# Patient Record
Sex: Female | Born: 1955 | Race: Black or African American | Hispanic: No | Smoking: Former smoker
Health system: Southern US, Community
[De-identification: ages and names within clinical notes are randomized; demographics above are authoritative.]

## PROBLEM LIST (undated history)

## (undated) DIAGNOSIS — I10 Essential (primary) hypertension: Secondary | ICD-10-CM

## (undated) DIAGNOSIS — R51 Headache: Secondary | ICD-10-CM

## (undated) DIAGNOSIS — R519 Headache, unspecified: Secondary | ICD-10-CM

## (undated) DIAGNOSIS — J40 Bronchitis, not specified as acute or chronic: Secondary | ICD-10-CM

## (undated) DIAGNOSIS — J45909 Unspecified asthma, uncomplicated: Secondary | ICD-10-CM

## (undated) HISTORY — PX: TUBAL LIGATION: SHX77

---

## 1999-03-12 ENCOUNTER — Emergency Department (HOSPITAL_COMMUNITY): Admission: EM | Admit: 1999-03-12 | Discharge: 1999-03-12 | Payer: Self-pay | Admitting: Emergency Medicine

## 1999-03-12 ENCOUNTER — Encounter: Payer: Self-pay | Admitting: Emergency Medicine

## 1999-05-18 ENCOUNTER — Emergency Department (HOSPITAL_COMMUNITY): Admission: EM | Admit: 1999-05-18 | Discharge: 1999-05-18 | Payer: Self-pay

## 2000-06-04 ENCOUNTER — Emergency Department (HOSPITAL_COMMUNITY): Admission: EM | Admit: 2000-06-04 | Discharge: 2000-06-04 | Payer: Self-pay | Admitting: Emergency Medicine

## 2000-08-20 ENCOUNTER — Emergency Department (HOSPITAL_COMMUNITY): Admission: EM | Admit: 2000-08-20 | Discharge: 2000-08-20 | Payer: Self-pay | Admitting: Emergency Medicine

## 2001-02-16 ENCOUNTER — Encounter: Payer: Self-pay | Admitting: Emergency Medicine

## 2001-02-16 ENCOUNTER — Emergency Department (HOSPITAL_COMMUNITY): Admission: EM | Admit: 2001-02-16 | Discharge: 2001-02-16 | Payer: Self-pay | Admitting: Emergency Medicine

## 2001-07-19 ENCOUNTER — Emergency Department (HOSPITAL_COMMUNITY): Admission: EM | Admit: 2001-07-19 | Discharge: 2001-07-19 | Payer: Self-pay | Admitting: Emergency Medicine

## 2002-10-05 ENCOUNTER — Emergency Department (HOSPITAL_COMMUNITY): Admission: EM | Admit: 2002-10-05 | Discharge: 2002-10-05 | Payer: Self-pay | Admitting: Emergency Medicine

## 2002-10-17 ENCOUNTER — Emergency Department (HOSPITAL_COMMUNITY): Admission: EM | Admit: 2002-10-17 | Discharge: 2002-10-17 | Payer: Self-pay | Admitting: Emergency Medicine

## 2002-11-10 ENCOUNTER — Emergency Department (HOSPITAL_COMMUNITY): Admission: EM | Admit: 2002-11-10 | Discharge: 2002-11-10 | Payer: Self-pay | Admitting: Emergency Medicine

## 2003-05-02 ENCOUNTER — Emergency Department (HOSPITAL_COMMUNITY): Admission: EM | Admit: 2003-05-02 | Discharge: 2003-05-02 | Payer: Self-pay | Admitting: Emergency Medicine

## 2003-05-02 ENCOUNTER — Encounter: Payer: Self-pay | Admitting: Emergency Medicine

## 2005-02-15 ENCOUNTER — Emergency Department (HOSPITAL_COMMUNITY): Admission: EM | Admit: 2005-02-15 | Discharge: 2005-02-15 | Payer: Self-pay | Admitting: Emergency Medicine

## 2005-02-17 ENCOUNTER — Emergency Department (HOSPITAL_COMMUNITY): Admission: EM | Admit: 2005-02-17 | Discharge: 2005-02-17 | Payer: Self-pay | Admitting: Emergency Medicine

## 2005-03-26 ENCOUNTER — Emergency Department (HOSPITAL_COMMUNITY): Admission: EM | Admit: 2005-03-26 | Discharge: 2005-03-26 | Payer: Self-pay | Admitting: Emergency Medicine

## 2005-11-07 ENCOUNTER — Emergency Department (HOSPITAL_COMMUNITY): Admission: EM | Admit: 2005-11-07 | Discharge: 2005-11-07 | Payer: Self-pay | Admitting: Emergency Medicine

## 2005-11-22 ENCOUNTER — Emergency Department (HOSPITAL_COMMUNITY): Admission: EM | Admit: 2005-11-22 | Discharge: 2005-11-22 | Payer: Self-pay | Admitting: *Deleted

## 2006-02-27 ENCOUNTER — Emergency Department (HOSPITAL_COMMUNITY): Admission: EM | Admit: 2006-02-27 | Discharge: 2006-02-27 | Payer: Self-pay | Admitting: Emergency Medicine

## 2007-03-20 ENCOUNTER — Emergency Department (HOSPITAL_COMMUNITY): Admission: EM | Admit: 2007-03-20 | Discharge: 2007-03-21 | Payer: Self-pay | Admitting: Emergency Medicine

## 2007-03-30 ENCOUNTER — Encounter: Admission: RE | Admit: 2007-03-30 | Discharge: 2007-05-06 | Payer: Self-pay | Admitting: Occupational Medicine

## 2007-08-02 ENCOUNTER — Encounter: Admission: RE | Admit: 2007-08-02 | Discharge: 2007-08-02 | Payer: Self-pay | Admitting: Occupational Medicine

## 2007-09-19 ENCOUNTER — Emergency Department (HOSPITAL_COMMUNITY): Admission: EM | Admit: 2007-09-19 | Discharge: 2007-09-19 | Payer: Self-pay | Admitting: Emergency Medicine

## 2008-07-28 ENCOUNTER — Emergency Department (HOSPITAL_COMMUNITY): Admission: EM | Admit: 2008-07-28 | Discharge: 2008-07-28 | Payer: Self-pay | Admitting: Emergency Medicine

## 2008-11-30 ENCOUNTER — Encounter: Admission: RE | Admit: 2008-11-30 | Discharge: 2008-11-30 | Payer: Self-pay | Admitting: Internal Medicine

## 2008-12-18 ENCOUNTER — Encounter: Admission: RE | Admit: 2008-12-18 | Discharge: 2008-12-18 | Payer: Self-pay | Admitting: Internal Medicine

## 2008-12-21 ENCOUNTER — Emergency Department (HOSPITAL_COMMUNITY): Admission: EM | Admit: 2008-12-21 | Discharge: 2008-12-21 | Payer: Self-pay | Admitting: Emergency Medicine

## 2008-12-29 ENCOUNTER — Ambulatory Visit (HOSPITAL_COMMUNITY): Admission: RE | Admit: 2008-12-29 | Discharge: 2008-12-29 | Payer: Self-pay | Admitting: Cardiovascular Disease

## 2009-02-15 ENCOUNTER — Emergency Department (HOSPITAL_COMMUNITY): Admission: EM | Admit: 2009-02-15 | Discharge: 2009-02-15 | Payer: Self-pay | Admitting: Emergency Medicine

## 2010-03-13 ENCOUNTER — Encounter: Admission: RE | Admit: 2010-03-13 | Discharge: 2010-03-13 | Payer: Self-pay | Admitting: Internal Medicine

## 2010-07-04 ENCOUNTER — Emergency Department (HOSPITAL_COMMUNITY)
Admission: EM | Admit: 2010-07-04 | Discharge: 2010-07-04 | Payer: Self-pay | Source: Home / Self Care | Admitting: Emergency Medicine

## 2010-10-06 ENCOUNTER — Encounter: Payer: Self-pay | Admitting: Internal Medicine

## 2010-12-25 LAB — DIFFERENTIAL
Eosinophils Relative: 2 % (ref 0–5)
Lymphocytes Relative: 29 % (ref 12–46)
Lymphs Abs: 2 10*3/uL (ref 0.7–4.0)
Monocytes Relative: 6 % (ref 3–12)

## 2010-12-25 LAB — POCT CARDIAC MARKERS
Myoglobin, poc: 36.9 ng/mL (ref 12–200)
Myoglobin, poc: 53 ng/mL (ref 12–200)

## 2010-12-25 LAB — POCT I-STAT, CHEM 8
BUN: 12 mg/dL (ref 6–23)
Hemoglobin: 13.3 g/dL (ref 12.0–15.0)
Sodium: 142 mEq/L (ref 135–145)
TCO2: 25 mmol/L (ref 0–100)

## 2010-12-25 LAB — URINALYSIS, ROUTINE W REFLEX MICROSCOPIC
Glucose, UA: NEGATIVE mg/dL
Hgb urine dipstick: NEGATIVE
Ketones, ur: NEGATIVE mg/dL
Protein, ur: NEGATIVE mg/dL

## 2010-12-25 LAB — CK TOTAL AND CKMB (NOT AT ARMC): Relative Index: INVALID (ref 0.0–2.5)

## 2010-12-25 LAB — URINE CULTURE: Colony Count: 80000

## 2010-12-25 LAB — URINE MICROSCOPIC-ADD ON

## 2010-12-25 LAB — CBC
HCT: 37.5 % (ref 36.0–46.0)
Platelets: 241 10*3/uL (ref 150–400)
RBC: 4.99 MIL/uL (ref 3.87–5.11)
WBC: 7 10*3/uL (ref 4.0–10.5)

## 2010-12-25 LAB — GLUCOSE, CAPILLARY

## 2010-12-25 LAB — BRAIN NATRIURETIC PEPTIDE: Pro B Natriuretic peptide (BNP): <30 pg/mL (ref 0.0–100.0)

## 2010-12-25 LAB — PROTIME-INR
INR: 0.9 (ref 0.00–1.49)
Prothrombin Time: 12.6 seconds (ref 11.6–15.2)

## 2011-01-28 NOTE — Cardiovascular Report (Signed)
NAMEJENNENE, DOWNIE             ACCOUNT NO.:  [id number]   MEDICAL RECORD NO.:  [id number]          PATIENT TYPE:  OIB   LOCATION:  2899                         FACILITY:  [facility]   PHYSICIAN:  Nanetta Batty, M.D.   DATE OF BIRTH:  1955-12-02   DATE OF PROCEDURE:  12/29/2008  DATE OF DISCHARGE:                            CARDIAC CATHETERIZATION   INDICATIONS FOR PROCEDURE:  Ms. Reifschneider is a 55 year old mildly  overweight African American female with positive cardiac risk factors  including a 25-pack year history of tobacco abuse and having quit 7  years ago, hypertension, hyperlipidemia, and non-insulin requiring  diabetes.  She has been having chest pain for the last 2-3 months,  occurring daily with poorly dull sensation substernally with some  shortness of breath and diaphoresis.  A 2-D echo was normal as was the  Myoview stress test.  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   DESCRIPTION OF PROCEDURE:  The patient was brought to the Second Floor  [facility] in the postabsorptive state.  She was  premedicated with p.o. Valium.  Her right groin was prepped and shaved  in the usual sterile fashion.  Xylocaine 1% was used for local  anesthesia.  A 6-French sheath was inserted into the right femoral  artery using standard Seldinger technique.  A 6-French right and left  Judkins diagnostic catheter as well as French pigtail catheter were used  for selective coronary angiography, left ventriculography, and distal  abdominal aortography.  Visipaque dye was used for the entirety of the  case.  Thoracic aorta, left ventricular, and pullback pressures were  recorded.   HEMODYNAMICS:  1. Aortic systolic pressure 147, diastolic pressure 73.  2. Left ventricular systolic pressure  153, end-diastolic pressure 19.   SELECTIVE CORONARY ANGIOGRAPHY:  1. Left main normal.  2. LAD normal.  3. Left circumflex normal.  4. Ramus branch was small and normal.  5. Right coronary is dominant and normal.   LEFT VENTRICULOGRAPHY:  RAO left ventriculogram was performed using 25  mL of Visipaque dye at 12 mL per second.  The overall LVEF was estimated  greater than 60% without focal wall motion abnormalities.   DISTAL ABDOMINAL AORTOGRAPHY:  Distal abdominal aortogram was performed  using 20 mL of Visipaque dye at 20 mL per second.  The renal arteries  were widely patent.  The infrarenal abdominal aorta and iliac  bifurcation appeared free of significant disease and atherosclerotic  changes.   IMPRESSION:  Ms. Pelot has normal coronary arteries and normal left  ventricular function.  I believe her chest pain is noncardiac.  Empiric  antireflux therapy will be recommended.   Sheath was removed and pressure was held on the groin to achieve  hemostasis.  The patient left the lab in stable condition.  She will be  discharged home later today as an outpatient and will see me back in the  office in approximately 1 week for followup.  Dr. Fleet Contras was  notified of these results.      Nanetta Batty, M.D.  Electronically Signed     JB/MEDQ  D:  12/29/2008  T:  12/29/2008  Job:  [id number]   cc:   Second Floor [facility]  [facility]  Fleet Contras, M.D.

## 2011-10-22 ENCOUNTER — Emergency Department (HOSPITAL_COMMUNITY)
Admission: EM | Admit: 2011-10-22 | Discharge: 2011-10-22 | Disposition: A | Discharge status: Home / Self Care | Payer: No Typology Code available for payment source | Attending: Emergency Medicine | Admitting: Emergency Medicine

## 2011-10-22 ENCOUNTER — Encounter (HOSPITAL_COMMUNITY): Payer: Self-pay | Admitting: Emergency Medicine

## 2011-10-22 DIAGNOSIS — T148XXA Other injury of unspecified body region, initial encounter: Secondary | ICD-10-CM | POA: Insufficient documentation

## 2011-10-22 DIAGNOSIS — M546 Pain in thoracic spine: Secondary | ICD-10-CM | POA: Insufficient documentation

## 2011-10-22 DIAGNOSIS — M545 Low back pain, unspecified: Secondary | ICD-10-CM | POA: Insufficient documentation

## 2011-10-22 DIAGNOSIS — E119 Type 2 diabetes mellitus without complications: Secondary | ICD-10-CM | POA: Insufficient documentation

## 2011-10-22 DIAGNOSIS — M542 Cervicalgia: Secondary | ICD-10-CM | POA: Insufficient documentation

## 2011-10-22 DIAGNOSIS — M25559 Pain in unspecified hip: Secondary | ICD-10-CM | POA: Insufficient documentation

## 2011-10-22 DIAGNOSIS — I10 Essential (primary) hypertension: Secondary | ICD-10-CM | POA: Insufficient documentation

## 2011-10-22 HISTORY — DX: Essential (primary) hypertension: I10

## 2011-10-22 MED ORDER — METHOCARBAMOL 500 MG PO TABS: 500.0000 mg | ORAL_TABLET | Freq: Two times a day (BID) | ORAL | Status: AC

## 2011-10-22 MED ORDER — IBUPROFEN 800 MG PO TABS: 800.0000 mg | ORAL_TABLET | Freq: Three times a day (TID) | ORAL | Status: AC

## 2011-10-22 MED ORDER — TRAMADOL HCL 50 MG PO TABS: 50.0000 mg | ORAL_TABLET | Freq: Four times a day (QID) | ORAL | Status: AC | PRN
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
## 2011-10-23 NOTE — ED Provider Notes (Signed)
Medical screening examination/treatment/procedure(s) were performed by non-physician practitioner and as supervising physician I was immediately available for consultation/collaboration.   Forbes Cellar, MD 10/23/11 1431

## 2012-05-07 ENCOUNTER — Encounter (HOSPITAL_COMMUNITY): Payer: Self-pay

## 2012-05-07 ENCOUNTER — Emergency Department (HOSPITAL_COMMUNITY)
Admission: EM | Admit: 2012-05-07 | Discharge: 2012-05-07 | Disposition: A | Discharge status: Home / Self Care | Payer: BC Managed Care – PPO | Attending: Emergency Medicine | Admitting: Emergency Medicine

## 2012-05-07 DIAGNOSIS — Z8249 Family history of ischemic heart disease and other diseases of the circulatory system: Secondary | ICD-10-CM | POA: Insufficient documentation

## 2012-05-07 DIAGNOSIS — I10 Essential (primary) hypertension: Secondary | ICD-10-CM | POA: Insufficient documentation

## 2012-05-07 DIAGNOSIS — Z87891 Personal history of nicotine dependence: Secondary | ICD-10-CM | POA: Insufficient documentation

## 2012-05-07 DIAGNOSIS — T2005XA Burn of unspecified degree of scalp [any part], initial encounter: Secondary | ICD-10-CM | POA: Insufficient documentation

## 2012-05-07 DIAGNOSIS — IMO0002 Reserved for concepts with insufficient information to code with codable children: Secondary | ICD-10-CM | POA: Insufficient documentation

## 2012-05-07 DIAGNOSIS — T2045XA Corrosion of unspecified degree of scalp [any part], initial encounter: Secondary | ICD-10-CM

## 2012-05-07 DIAGNOSIS — Z885 Allergy status to narcotic agent status: Secondary | ICD-10-CM | POA: Insufficient documentation

## 2012-05-07 DIAGNOSIS — Z833 Family history of diabetes mellitus: Secondary | ICD-10-CM | POA: Insufficient documentation

## 2012-05-07 DIAGNOSIS — E119 Type 2 diabetes mellitus without complications: Secondary | ICD-10-CM | POA: Insufficient documentation

## 2012-05-07 HISTORY — DX: Bronchitis, not specified as acute or chronic: J40

## 2012-05-07 MED ORDER — KETOROLAC TROMETHAMINE 30 MG/ML IJ SOLN
30.0000 mg | Freq: Once | INTRAMUSCULAR | Status: AC
Start: 2012-05-07 — End: 2012-05-07
  Administered 2012-05-07: 30 mg via INTRAMUSCULAR
  Filled 2012-05-07: qty 1

## 2012-05-07 MED ORDER — IBUPROFEN 800 MG PO TABS: 800.0000 mg | ORAL_TABLET | Freq: Three times a day (TID) | ORAL | Status: AC
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
## 2012-06-14 ENCOUNTER — Other Ambulatory Visit: Payer: Self-pay | Admitting: Internal Medicine

## 2012-06-14 DIAGNOSIS — R921 Mammographic calcification found on diagnostic imaging of breast: Secondary | ICD-10-CM

## 2012-07-21 ENCOUNTER — Ambulatory Visit
Admission: RE | Admit: 2012-07-21 | Discharge: 2012-07-21 | Disposition: A | Discharge status: Home / Self Care | Payer: BC Managed Care – PPO | Source: Ambulatory Visit | Attending: Internal Medicine | Admitting: Internal Medicine

## 2012-07-21 DIAGNOSIS — R921 Mammographic calcification found on diagnostic imaging of breast: Secondary | ICD-10-CM

## 2012-11-06 ENCOUNTER — Emergency Department (HOSPITAL_COMMUNITY)
Admission: EM | Admit: 2012-11-06 | Discharge: 2012-11-06 | Disposition: A | Discharge status: Home / Self Care | Payer: BC Managed Care – PPO | Attending: Emergency Medicine | Admitting: Emergency Medicine

## 2012-11-06 DIAGNOSIS — Z79899 Other long term (current) drug therapy: Secondary | ICD-10-CM | POA: Insufficient documentation

## 2012-11-06 DIAGNOSIS — M5416 Radiculopathy, lumbar region: Secondary | ICD-10-CM

## 2012-11-06 DIAGNOSIS — X503XXA Overexertion from repetitive movements, initial encounter: Secondary | ICD-10-CM | POA: Insufficient documentation

## 2012-11-06 DIAGNOSIS — Z7982 Long term (current) use of aspirin: Secondary | ICD-10-CM | POA: Insufficient documentation

## 2012-11-06 DIAGNOSIS — E119 Type 2 diabetes mellitus without complications: Secondary | ICD-10-CM | POA: Insufficient documentation

## 2012-11-06 DIAGNOSIS — I10 Essential (primary) hypertension: Secondary | ICD-10-CM | POA: Insufficient documentation

## 2012-11-06 DIAGNOSIS — S8990XA Unspecified injury of unspecified lower leg, initial encounter: Secondary | ICD-10-CM | POA: Insufficient documentation

## 2012-11-06 DIAGNOSIS — Z87891 Personal history of nicotine dependence: Secondary | ICD-10-CM | POA: Insufficient documentation

## 2012-11-06 DIAGNOSIS — Y9389 Activity, other specified: Secondary | ICD-10-CM | POA: Insufficient documentation

## 2012-11-06 DIAGNOSIS — Z8709 Personal history of other diseases of the respiratory system: Secondary | ICD-10-CM | POA: Insufficient documentation

## 2012-11-06 DIAGNOSIS — M549 Dorsalgia, unspecified: Secondary | ICD-10-CM

## 2012-11-06 DIAGNOSIS — Y9289 Other specified places as the place of occurrence of the external cause: Secondary | ICD-10-CM | POA: Insufficient documentation

## 2012-11-06 DIAGNOSIS — IMO0002 Reserved for concepts with insufficient information to code with codable children: Secondary | ICD-10-CM | POA: Insufficient documentation

## 2012-11-06 MED ORDER — HYDROCODONE-ACETAMINOPHEN 5-325 MG PO TABS: 1.0000 | ORAL_TABLET | Freq: Four times a day (QID) | ORAL | Status: DC | PRN

## 2012-11-06 MED ORDER — CYCLOBENZAPRINE HCL 10 MG PO TABS: 10.0000 mg | ORAL_TABLET | Freq: Two times a day (BID) | ORAL | Status: DC | PRN

## 2012-11-06 MED ORDER — PREDNISONE 20 MG PO TABS: ORAL_TABLET | ORAL | Status: DC

## 2012-11-06 MED ORDER — PREDNISONE 20 MG PO TABS
60.0000 mg | ORAL_TABLET | Freq: Once | ORAL | Status: AC
  Administered 2012-11-06: 60 mg via ORAL
  Filled 2012-11-06: qty 3
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
## 2012-11-08 NOTE — ED Provider Notes (Signed)
Medical screening examination/treatment/procedure(s) were performed by non-physician practitioner and as supervising physician I was immediately available for consultation/collaboration.  Erinne Gillentine M Maleeya Peterkin, MD 11/08/12 1902 

## 2013-02-08 ENCOUNTER — Emergency Department (HOSPITAL_COMMUNITY)
Admission: EM | Admit: 2013-02-08 | Discharge: 2013-02-08 | Disposition: A | Discharge status: Home / Self Care | Payer: BC Managed Care – PPO | Source: Home / Self Care

## 2013-08-26 ENCOUNTER — Encounter (HOSPITAL_COMMUNITY): Payer: Self-pay | Admitting: Emergency Medicine

## 2013-08-26 ENCOUNTER — Emergency Department (HOSPITAL_COMMUNITY)
Admission: EM | Admit: 2013-08-26 | Discharge: 2013-08-26 | Disposition: A | Discharge status: Home / Self Care | Payer: No Typology Code available for payment source | Attending: Emergency Medicine | Admitting: Emergency Medicine

## 2013-08-26 ENCOUNTER — Emergency Department (HOSPITAL_COMMUNITY): Payer: No Typology Code available for payment source

## 2013-08-26 DIAGNOSIS — Y9389 Activity, other specified: Secondary | ICD-10-CM | POA: Insufficient documentation

## 2013-08-26 DIAGNOSIS — I10 Essential (primary) hypertension: Secondary | ICD-10-CM | POA: Insufficient documentation

## 2013-08-26 DIAGNOSIS — H9209 Otalgia, unspecified ear: Secondary | ICD-10-CM | POA: Insufficient documentation

## 2013-08-26 DIAGNOSIS — S29012A Strain of muscle and tendon of back wall of thorax, initial encounter: Secondary | ICD-10-CM

## 2013-08-26 DIAGNOSIS — Z79899 Other long term (current) drug therapy: Secondary | ICD-10-CM | POA: Insufficient documentation

## 2013-08-26 DIAGNOSIS — Y92410 Unspecified street and highway as the place of occurrence of the external cause: Secondary | ICD-10-CM | POA: Insufficient documentation

## 2013-08-26 DIAGNOSIS — Z87891 Personal history of nicotine dependence: Secondary | ICD-10-CM | POA: Insufficient documentation

## 2013-08-26 DIAGNOSIS — S0990XA Unspecified injury of head, initial encounter: Secondary | ICD-10-CM | POA: Insufficient documentation

## 2013-08-26 DIAGNOSIS — IMO0002 Reserved for concepts with insufficient information to code with codable children: Secondary | ICD-10-CM | POA: Insufficient documentation

## 2013-08-26 DIAGNOSIS — R197 Diarrhea, unspecified: Secondary | ICD-10-CM | POA: Insufficient documentation

## 2013-08-26 DIAGNOSIS — E119 Type 2 diabetes mellitus without complications: Secondary | ICD-10-CM | POA: Insufficient documentation

## 2013-08-26 DIAGNOSIS — J3489 Other specified disorders of nose and nasal sinuses: Secondary | ICD-10-CM | POA: Insufficient documentation

## 2013-08-26 DIAGNOSIS — S0993XA Unspecified injury of face, initial encounter: Secondary | ICD-10-CM | POA: Insufficient documentation

## 2013-08-26 DIAGNOSIS — J40 Bronchitis, not specified as acute or chronic: Secondary | ICD-10-CM

## 2013-08-26 MED ORDER — ALBUTEROL SULFATE (5 MG/ML) 0.5% IN NEBU
2.5000 mg | INHALATION_SOLUTION | RESPIRATORY_TRACT | Status: DC
  Administered 2013-08-26: 2.5 mg via RESPIRATORY_TRACT
  Filled 2013-08-26: qty 0.5

## 2013-08-26 MED ORDER — PREDNISONE 20 MG PO TABS: 40.0000 mg | ORAL_TABLET | Freq: Every day | ORAL | Status: DC

## 2013-08-26 MED ORDER — IBUPROFEN 800 MG PO TABS: 800.0000 mg | ORAL_TABLET | Freq: Three times a day (TID) | ORAL | Status: DC

## 2013-08-26 MED ORDER — IBUPROFEN 400 MG PO TABS
800.0000 mg | ORAL_TABLET | Freq: Once | ORAL | Status: AC
  Administered 2013-08-26: 800 mg via ORAL
  Filled 2013-08-26: qty 2

## 2013-08-26 MED ORDER — IPRATROPIUM BROMIDE 0.02 % IN SOLN
0.5000 mg | RESPIRATORY_TRACT | Status: DC
  Administered 2013-08-26: 0.5 mg via RESPIRATORY_TRACT
  Filled 2013-08-26: qty 2.5

## 2013-08-26 MED ORDER — ALBUTEROL SULFATE HFA 108 (90 BASE) MCG/ACT IN AERS: 1.0000 | INHALATION_SPRAY | Freq: Four times a day (QID) | RESPIRATORY_TRACT | Status: DC | PRN
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
## 2013-08-29 NOTE — ED Provider Notes (Signed)
Medical screening examination/treatment/procedure(s) were performed by non-physician practitioner and as supervising physician I was immediately available for consultation/collaboration.  EKG Interpretation   None         Aubriee Szeto M Aarsh Fristoe, DO 08/29/13 0816 

## 2014-01-23 ENCOUNTER — Encounter (HOSPITAL_COMMUNITY): Payer: Self-pay | Admitting: Emergency Medicine

## 2014-01-23 ENCOUNTER — Emergency Department (HOSPITAL_COMMUNITY)
Admission: EM | Admit: 2014-01-23 | Discharge: 2014-01-23 | Disposition: A | Discharge status: Home / Self Care | Payer: Worker's Compensation | Attending: Emergency Medicine | Admitting: Emergency Medicine

## 2014-01-23 ENCOUNTER — Emergency Department (HOSPITAL_COMMUNITY): Payer: Worker's Compensation

## 2014-01-23 DIAGNOSIS — Y9389 Activity, other specified: Secondary | ICD-10-CM | POA: Insufficient documentation

## 2014-01-23 DIAGNOSIS — S4980XA Other specified injuries of shoulder and upper arm, unspecified arm, initial encounter: Secondary | ICD-10-CM | POA: Insufficient documentation

## 2014-01-23 DIAGNOSIS — Z87891 Personal history of nicotine dependence: Secondary | ICD-10-CM | POA: Insufficient documentation

## 2014-01-23 DIAGNOSIS — IMO0002 Reserved for concepts with insufficient information to code with codable children: Secondary | ICD-10-CM | POA: Insufficient documentation

## 2014-01-23 DIAGNOSIS — J45909 Unspecified asthma, uncomplicated: Secondary | ICD-10-CM | POA: Insufficient documentation

## 2014-01-23 DIAGNOSIS — Y92410 Unspecified street and highway as the place of occurrence of the external cause: Secondary | ICD-10-CM | POA: Insufficient documentation

## 2014-01-23 DIAGNOSIS — R42 Dizziness and giddiness: Secondary | ICD-10-CM | POA: Insufficient documentation

## 2014-01-23 DIAGNOSIS — S0993XA Unspecified injury of face, initial encounter: Secondary | ICD-10-CM | POA: Insufficient documentation

## 2014-01-23 DIAGNOSIS — S199XXA Unspecified injury of neck, initial encounter: Secondary | ICD-10-CM

## 2014-01-23 DIAGNOSIS — I10 Essential (primary) hypertension: Secondary | ICD-10-CM | POA: Insufficient documentation

## 2014-01-23 DIAGNOSIS — S46909A Unspecified injury of unspecified muscle, fascia and tendon at shoulder and upper arm level, unspecified arm, initial encounter: Secondary | ICD-10-CM | POA: Insufficient documentation

## 2014-01-23 DIAGNOSIS — E119 Type 2 diabetes mellitus without complications: Secondary | ICD-10-CM | POA: Insufficient documentation

## 2014-01-23 DIAGNOSIS — Z791 Long term (current) use of non-steroidal anti-inflammatories (NSAID): Secondary | ICD-10-CM | POA: Insufficient documentation

## 2014-01-23 DIAGNOSIS — Z79899 Other long term (current) drug therapy: Secondary | ICD-10-CM | POA: Insufficient documentation

## 2014-01-23 DIAGNOSIS — S0990XA Unspecified injury of head, initial encounter: Secondary | ICD-10-CM | POA: Insufficient documentation

## 2014-01-23 HISTORY — DX: Unspecified asthma, uncomplicated: J45.909

## 2014-01-23 MED ORDER — IBUPROFEN 800 MG PO TABS: 800.0000 mg | ORAL_TABLET | Freq: Three times a day (TID) | ORAL | Status: DC

## 2014-01-23 MED ORDER — METHOCARBAMOL 500 MG PO TABS: 500.0000 mg | ORAL_TABLET | Freq: Two times a day (BID) | ORAL | Status: DC

## 2014-01-23 MED ORDER — METHOCARBAMOL 500 MG PO TABS
500.0000 mg | ORAL_TABLET | Freq: Once | ORAL | Status: AC
  Administered 2014-01-23: 500 mg via ORAL
  Filled 2014-01-23: qty 1
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
## 2014-01-23 NOTE — ED Provider Notes (Signed)
Medical screening examination/treatment/procedure(s) were performed by non-physician practitioner and as supervising physician I was immediately available for consultation/collaboration.   EKG Interpretation None        Hector Taft, MD 01/23/14 1615 
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
## 2016-08-30 ENCOUNTER — Emergency Department (HOSPITAL_COMMUNITY): Payer: BC Managed Care – PPO

## 2016-08-30 ENCOUNTER — Inpatient Hospital Stay (HOSPITAL_COMMUNITY)
Admission: EM | Admit: 2016-08-30 | Discharge: 2016-09-03 | DRG: 339 | Disposition: A | Discharge status: Home / Self Care | Payer: BC Managed Care – PPO | Attending: Emergency Medicine | Admitting: Emergency Medicine

## 2016-08-30 ENCOUNTER — Encounter (HOSPITAL_COMMUNITY): Payer: Self-pay | Admitting: Emergency Medicine

## 2016-08-30 DIAGNOSIS — J45909 Unspecified asthma, uncomplicated: Secondary | ICD-10-CM | POA: Diagnosis present

## 2016-08-30 DIAGNOSIS — I10 Essential (primary) hypertension: Secondary | ICD-10-CM | POA: Diagnosis present

## 2016-08-30 DIAGNOSIS — N3080 Other cystitis without hematuria: Secondary | ICD-10-CM | POA: Diagnosis present

## 2016-08-30 DIAGNOSIS — J302 Other seasonal allergic rhinitis: Secondary | ICD-10-CM

## 2016-08-30 DIAGNOSIS — J449 Chronic obstructive pulmonary disease, unspecified: Secondary | ICD-10-CM | POA: Diagnosis present

## 2016-08-30 DIAGNOSIS — E119 Type 2 diabetes mellitus without complications: Secondary | ICD-10-CM | POA: Diagnosis present

## 2016-08-30 DIAGNOSIS — Z7984 Long term (current) use of oral hypoglycemic drugs: Secondary | ICD-10-CM

## 2016-08-30 DIAGNOSIS — D649 Anemia, unspecified: Secondary | ICD-10-CM | POA: Diagnosis present

## 2016-08-30 DIAGNOSIS — E669 Obesity, unspecified: Secondary | ICD-10-CM

## 2016-08-30 DIAGNOSIS — Z794 Long term (current) use of insulin: Secondary | ICD-10-CM

## 2016-08-30 DIAGNOSIS — Z79899 Other long term (current) drug therapy: Secondary | ICD-10-CM

## 2016-08-30 DIAGNOSIS — E1165 Type 2 diabetes mellitus with hyperglycemia: Secondary | ICD-10-CM | POA: Diagnosis present

## 2016-08-30 DIAGNOSIS — N179 Acute kidney failure, unspecified: Secondary | ICD-10-CM | POA: Diagnosis present

## 2016-08-30 DIAGNOSIS — K353 Acute appendicitis with localized peritonitis: Principal | ICD-10-CM | POA: Diagnosis present

## 2016-08-30 DIAGNOSIS — J4520 Mild intermittent asthma, uncomplicated: Secondary | ICD-10-CM | POA: Diagnosis not present

## 2016-08-30 DIAGNOSIS — Z6839 Body mass index (BMI) 39.0-39.9, adult: Secondary | ICD-10-CM

## 2016-08-30 DIAGNOSIS — Z885 Allergy status to narcotic agent status: Secondary | ICD-10-CM

## 2016-08-30 DIAGNOSIS — K3532 Acute appendicitis with perforation and localized peritonitis, without abscess: Secondary | ICD-10-CM | POA: Diagnosis present

## 2016-08-30 DIAGNOSIS — K3580 Unspecified acute appendicitis: Secondary | ICD-10-CM

## 2016-08-30 DIAGNOSIS — Z9851 Tubal ligation status: Secondary | ICD-10-CM

## 2016-08-30 DIAGNOSIS — Z87891 Personal history of nicotine dependence: Secondary | ICD-10-CM

## 2016-08-30 LAB — COMPREHENSIVE METABOLIC PANEL
ALT: 26 U/L (ref 14–54)
AST: 29 U/L (ref 15–41)
Albumin: 4.1 g/dL (ref 3.5–5.0)
Alkaline Phosphatase: 115 U/L (ref 38–126)
Anion gap: 11 (ref 5–15)
BUN: 17 mg/dL (ref 6–20)
CO2: 24 mmol/L (ref 22–32)
Calcium: 9.6 mg/dL (ref 8.9–10.3)
Chloride: 100 mmol/L — ABNORMAL LOW (ref 101–111)
Creatinine, Ser: 0.86 mg/dL (ref 0.44–1.00)
GFR calc Af Amer: >60 mL/min (ref >60)
GFR calc non Af Amer: >60 mL/min (ref >60)
Glucose, Bld: 214 mg/dL — ABNORMAL HIGH (ref 65–99)
Potassium: 4 mmol/L (ref 3.5–5.1)
Sodium: 135 mmol/L (ref 135–145)
Total Bilirubin: 1.3 mg/dL — ABNORMAL HIGH (ref 0.3–1.2)
Total Protein: 8.4 g/dL — ABNORMAL HIGH (ref 6.5–8.1)

## 2016-08-30 LAB — CBC WITH DIFFERENTIAL/PLATELET
Basophils Absolute: 0 10*3/uL (ref 0.0–0.1)
Basophils Relative: 0 %
Eosinophils Absolute: 0 10*3/uL (ref 0.0–0.7)
Eosinophils Relative: 0 %
HCT: 31.8 % — ABNORMAL LOW (ref 36.0–46.0)
Hemoglobin: 10.4 g/dL — ABNORMAL LOW (ref 12.0–15.0)
Lymphocytes Relative: 8 %
Lymphs Abs: 1.2 10*3/uL (ref 0.7–4.0)
MCH: 23.9 pg — ABNORMAL LOW (ref 26.0–34.0)
MCHC: 32.7 g/dL (ref 30.0–36.0)
MCV: 73.1 fL — ABNORMAL LOW (ref 78.0–100.0)
Monocytes Absolute: 0.6 10*3/uL (ref 0.1–1.0)
Monocytes Relative: 4 %
Neutro Abs: 12.7 10*3/uL — ABNORMAL HIGH (ref 1.7–7.7)
Neutrophils Relative %: 88 %
Platelets: 256 10*3/uL (ref 150–400)
RBC: 4.35 MIL/uL (ref 3.87–5.11)
RDW: 12.9 % (ref 11.5–15.5)
WBC: 14.5 10*3/uL — ABNORMAL HIGH (ref 4.0–10.5)

## 2016-08-30 LAB — URINALYSIS, ROUTINE W REFLEX MICROSCOPIC
Bilirubin Urine: NEGATIVE
Glucose, UA: NEGATIVE mg/dL
Hgb urine dipstick: NEGATIVE
Ketones, ur: NEGATIVE mg/dL
Leukocytes, UA: NEGATIVE
Nitrite: NEGATIVE
Protein, ur: NEGATIVE mg/dL
Specific Gravity, Urine: 1.018 (ref 1.005–1.030)
pH: 7 (ref 5.0–8.0)

## 2016-08-30 LAB — LIPASE, BLOOD: Lipase: 30 U/L (ref 11–51)

## 2016-08-30 LAB — I-STAT CG4 LACTIC ACID, ED
Lactic Acid, Venous: 1.4 mmol/L (ref 0.5–1.9)
Lactic Acid, Venous: 1.24 mmol/L (ref 0.5–1.9)

## 2016-08-30 MED ORDER — ACETAMINOPHEN 500 MG PO TABS
1000.0000 mg | ORAL_TABLET | Freq: Once | ORAL | Status: AC
  Administered 2016-08-30: 1000 mg via ORAL
  Filled 2016-08-30: qty 2

## 2016-08-30 MED ORDER — SODIUM CHLORIDE 0.9 % IV BOLUS (SEPSIS)
1000.0000 mL | Freq: Once | INTRAVENOUS | Status: AC
  Administered 2016-08-30: 1000 mL via INTRAVENOUS

## 2016-08-30 MED ORDER — FENTANYL CITRATE (PF) 100 MCG/2ML IJ SOLN
50.0000 ug | Freq: Once | INTRAMUSCULAR | Status: AC
  Administered 2016-08-30: 50 ug via INTRAVENOUS
  Filled 2016-08-30: qty 2

## 2016-08-30 MED ORDER — ONDANSETRON HCL 4 MG/2ML IJ SOLN
4.0000 mg | Freq: Once | INTRAMUSCULAR | Status: AC
  Administered 2016-08-30: 4 mg via INTRAVENOUS
  Filled 2016-08-30: qty 2

## 2016-08-30 MED ORDER — PIPERACILLIN-TAZOBACTAM 3.375 G IVPB
3.3750 g | Freq: Once | INTRAVENOUS | Status: AC
  Administered 2016-08-30: 3.375 g via INTRAVENOUS
  Filled 2016-08-30: qty 50

## 2016-08-30 MED ORDER — SODIUM CHLORIDE 0.9 % IJ SOLN
INTRAMUSCULAR | Status: AC
  Filled 2016-08-30: qty 50

## 2016-08-30 MED ORDER — IOPAMIDOL (ISOVUE-300) INJECTION 61%
INTRAVENOUS | Status: AC
  Administered 2016-08-30: 100 mL via INTRAVENOUS
  Filled 2016-08-30: qty 100

## 2016-08-30 NOTE — ED Triage Notes (Signed)
Pt c/o lower right sided abdominal/pelvic area sharp pain onset of Thursday. Pt c/o headache and chills onset of today. Also a painful sensation when urinating.  Pt states chiropractor has done xray earlier this year and noticed an abnormality of her scan but pt is unsure of what it was.

## 2016-08-30 NOTE — ED Notes (Signed)
Pt's daughter not happy with waiting in lobby until room available . Attempted to explain waiting process and that we are doing lab work and observing for any changes  that can prompt to move pt quickly to the acute side. Daughter is extremely rude to this RN and did not appear interested in what was explained.

## 2016-08-30 NOTE — ED Provider Notes (Signed)
[facility] DEPT Provider Note   CSN: [id number] Arrival date & time: 08/30/16  1552     History   Chief Complaint Chief Complaint  Patient presents with  . Abdominal Pain    HPI Cassandra Gould is a 60 y.o. female.  Patient complains of pain in her right lower lateral abdomen radiating to the suprapubic area since Thursday with associated fever and chills.  Poor appetite.   No vaginal bleeding or discharge. Severity of pain is moderate. Palpation and positioning make pain worse.      Past Medical History:  Diagnosis Date  . Asthma    bronchites  . Bronchitis   . Diabetes mellitus   . Hypertension     There are no active problems to display for this patient.   Past Surgical History:  Procedure Laterality Date  . TUBAL LIGATION      OB History    No data available       Home Medications    Prior to Admission medications   Medication Sig Start Date End Date Taking? Authorizing Provider  albuterol (PROVENTIL HFA;VENTOLIN HFA) 108 (90 BASE) MCG/ACT inhaler Inhale 1-2 puffs into the lungs every 6 (six) hours as needed for wheezing or shortness of breath. 08/26/13  Yes Harvie Heck, PA-C  aspirin-acetaminophen-caffeine (EXCEDRIN MIGRAINE) [phone] MG per tablet Take 1 tablet by mouth every 6 (six) hours as needed. For migraine   Yes Historical Provider, MD  cetirizine (ZYRTEC) 10 MG tablet Take 10 mg by mouth daily.   Yes Historical Provider, MD  glimepiride (AMARYL) 4 MG tablet Take 4 mg by mouth 2 (two) times daily.    Yes Historical Provider, MD  lisinopril-hydrochlorothiazide (PRINZIDE,ZESTORETIC) 20-12.5 MG per tablet Take 1 tablet by mouth every morning.    Yes Historical Provider, MD  pioglitazone-metformin (ACTOPLUS MET) 15-500 MG per tablet Take 1 tablet by mouth 2 (two) times daily with a meal.   Yes Historical Provider, MD  simvastatin (ZOCOR) 40 MG tablet Take 40 mg by mouth every evening.   Yes Historical Provider, MD  Vitamin D, Ergocalciferol,  (DRISDOL) 50000 UNITS CAPS Take 50,000 Units by mouth every Thursday.   Yes Historical Provider, MD    Family History Family History  Problem Relation Age of Onset  . Hypertension Mother   . Diabetes Mother   . Diabetes Sister     Social History Social History  Substance Use Topics  . Smoking status: Former Research scientist (life sciences)  . Smokeless tobacco: Never Used  . Alcohol use No     Allergies   Oxycodone   Review of Systems Review of Systems  All other systems reviewed and are negative.    Physical Exam Updated Vital Signs BP 100/59 (BP Location: Right Arm)   Pulse 110   Temp 100.2 F (37.9 C) (Oral)   Resp 18   Ht '5\' 11"'  (1.803 m)   Wt 280 lb (127 kg)   SpO2 95%   BMI 39.05 kg/m   Physical Exam  Constitutional: She is oriented to person, place, and time.  obese  HENT:  Head: Normocephalic and atraumatic.  Eyes: Conjunctivae are normal.  Neck: Neck supple.  Cardiovascular: Normal rate and regular rhythm.   Pulmonary/Chest: Effort normal and breath sounds normal.  Abdominal:  Tenderness from right inferior lateral abdomen to suprapubic area  Musculoskeletal: Normal range of motion.  Neurological: She is alert and oriented to person, place, and time.  Skin: Skin is warm and dry.  Psychiatric: She has a normal mood and  affect. Her behavior is normal.  Nursing note and vitals reviewed.    ED Treatments / Results  Labs (all labs ordered are listed, but only abnormal results are displayed) Labs Reviewed  CBC WITH DIFFERENTIAL/PLATELET - Abnormal; Notable for the following:       Result Value   WBC 14.5 (*)    Hemoglobin 10.4 (*)    HCT 31.8 (*)    MCV 73.1 (*)    MCH 23.9 (*)    Neutro Abs 12.7 (*)    All other components within normal limits  COMPREHENSIVE METABOLIC PANEL - Abnormal; Notable for the following:    Chloride 100 (*)    Glucose, Bld 214 (*)    Total Protein 8.4 (*)    Total Bilirubin 1.3 (*)    All other components within normal limits  URINE  CULTURE  URINALYSIS, ROUTINE W REFLEX MICROSCOPIC  LIPASE, BLOOD  I-STAT CG4 LACTIC ACID, ED  I-STAT CG4 LACTIC ACID, ED    EKG  EKG Interpretation None       Radiology Dg Chest 2 View  Result Date: 08/30/2016 CLINICAL DATA:  60 y/o F; right-sided abdominal pain as well as cough and shortness of breath for 1 week. EXAM: CHEST  2 VIEW COMPARISON:  08/26/2013 chest radiograph. FINDINGS: Stable cardiac silhouette given projection and technique. Aortic atherosclerosis with arch calcification. Clear lungs. No pleural effusion. Bones are unremarkable. IMPRESSION: No active cardiopulmonary disease.  Aortic atherosclerosis. Electronically Signed   By: Kristine Garbe M.D.   On: 08/30/2016 20:51   Ct Abdomen Pelvis W Contrast  Result Date: 08/30/2016 CLINICAL DATA:  Lower right abdominal pain. EXAM: CT ABDOMEN AND PELVIS WITH CONTRAST TECHNIQUE: Multidetector CT imaging of the abdomen and pelvis was performed using the standard protocol following bolus administration of intravenous contrast. CONTRAST:  150m ISOVUE-300 IOPAMIDOL (ISOVUE-300) INJECTION 61% COMPARISON:  None. FINDINGS: Lower chest: No acute abnormality. Hepatobiliary: Probable tiny cyst in the liver on series 2, image 13. The liver, gallbladder, and portal vein are otherwise normal. Pancreas: Unremarkable. No pancreatic ductal dilatation or surrounding inflammatory changes. Spleen: Normal in size without focal abnormality. Adrenals/Urinary Tract: Adrenal glands are unremarkable. Kidneys are normal, without renal calculi, focal lesion, or hydronephrosis. Bladder is unremarkable. Stomach/Bowel: The stomach and small bowel are normal. The colon is normal. The appendix is enlarged and inflamed consistent with appendicitis without perforation. Vascular/Lymphatic: Atherosclerotic change seen in the non aneurysmal aorta. No adenopathy. Reproductive: Large calcified fibroid in the uterus. Other: No abdominal wall hernia or abnormality.  No abdominopelvic ascites. Musculoskeletal: Pedicle rods and screws at L4 through S1 with disc spacer devices at the same levels. IMPRESSION: 1. Appendicitis without evidence of perforation or abscess. 2. Atherosclerotic change in the abdominal aorta. Electronically Signed   By: DDorise BullionIII M.D   On: 08/30/2016 22:01    Procedures Procedures (including critical care time)  Medications Ordered in ED Medications  sodium chloride 0.9 % injection (not administered)  sodium chloride 0.9 % bolus 1,000 mL (1,000 mLs Intravenous New Bag/Given 08/30/16 2037)  ondansetron (ZOFRAN) injection 4 mg (4 mg Intravenous Given 08/30/16 2036)  sodium chloride 0.9 % bolus 1,000 mL (1,000 mLs Intravenous New Bag/Given 08/30/16 2036)  fentaNYL (SUBLIMAZE) injection 50 mcg (50 mcg Intravenous Given 08/30/16 2036)  acetaminophen (TYLENOL) tablet 1,000 mg (1,000 mg Oral Given 08/30/16 2036)  iopamidol (ISOVUE-300) 61 % injection (100 mLs Intravenous Contrast Given 08/30/16 2135)     Initial Impression / Assessment and Plan / ED Course  I  have reviewed the triage vital signs and the nursing notes.  Pertinent labs & imaging results that were available during my care of the patient were reviewed by me and considered in my medical decision making (see chart for details).  Clinical Course     CT scan reveals appendicitis without perforation or abscess. IV Zosyn. Discussed with general surgery.  Final Clinical Impressions(s) / ED Diagnoses   Final diagnoses:  Acute appendicitis, unspecified acute appendicitis type    New Prescriptions New Prescriptions   No medications on file     Nat Christen, MD 08/30/16 2239

## 2016-08-30 NOTE — ED Notes (Signed)
Patient transported to CT 

## 2016-08-31 ENCOUNTER — Inpatient Hospital Stay (HOSPITAL_COMMUNITY): Payer: BC Managed Care – PPO | Admitting: Anesthesiology

## 2016-08-31 ENCOUNTER — Encounter (HOSPITAL_COMMUNITY): Payer: Self-pay | Admitting: Surgery

## 2016-08-31 ENCOUNTER — Encounter (HOSPITAL_COMMUNITY): Admission: EM | Disposition: A | Discharge status: Home / Self Care | Payer: Self-pay | Source: Home / Self Care

## 2016-08-31 DIAGNOSIS — G43909 Migraine, unspecified, not intractable, without status migrainosus: Secondary | ICD-10-CM | POA: Insufficient documentation

## 2016-08-31 DIAGNOSIS — Z794 Long term (current) use of insulin: Secondary | ICD-10-CM

## 2016-08-31 DIAGNOSIS — I10 Essential (primary) hypertension: Secondary | ICD-10-CM | POA: Diagnosis present

## 2016-08-31 DIAGNOSIS — D649 Anemia, unspecified: Secondary | ICD-10-CM | POA: Diagnosis present

## 2016-08-31 DIAGNOSIS — Z885 Allergy status to narcotic agent status: Secondary | ICD-10-CM | POA: Diagnosis not present

## 2016-08-31 DIAGNOSIS — N3080 Other cystitis without hematuria: Secondary | ICD-10-CM | POA: Diagnosis present

## 2016-08-31 DIAGNOSIS — Z79899 Other long term (current) drug therapy: Secondary | ICD-10-CM | POA: Diagnosis not present

## 2016-08-31 DIAGNOSIS — E669 Obesity, unspecified: Secondary | ICD-10-CM

## 2016-08-31 DIAGNOSIS — J45909 Unspecified asthma, uncomplicated: Secondary | ICD-10-CM | POA: Diagnosis present

## 2016-08-31 DIAGNOSIS — J449 Chronic obstructive pulmonary disease, unspecified: Secondary | ICD-10-CM | POA: Diagnosis present

## 2016-08-31 DIAGNOSIS — K3532 Acute appendicitis with perforation and localized peritonitis, without abscess: Secondary | ICD-10-CM | POA: Diagnosis present

## 2016-08-31 DIAGNOSIS — Z87891 Personal history of nicotine dependence: Secondary | ICD-10-CM | POA: Diagnosis not present

## 2016-08-31 DIAGNOSIS — Z7984 Long term (current) use of oral hypoglycemic drugs: Secondary | ICD-10-CM | POA: Diagnosis not present

## 2016-08-31 DIAGNOSIS — E119 Type 2 diabetes mellitus without complications: Secondary | ICD-10-CM | POA: Diagnosis present

## 2016-08-31 DIAGNOSIS — N179 Acute kidney failure, unspecified: Secondary | ICD-10-CM | POA: Diagnosis present

## 2016-08-31 DIAGNOSIS — Z9851 Tubal ligation status: Secondary | ICD-10-CM | POA: Diagnosis not present

## 2016-08-31 DIAGNOSIS — Z6839 Body mass index (BMI) 39.0-39.9, adult: Secondary | ICD-10-CM | POA: Diagnosis not present

## 2016-08-31 DIAGNOSIS — J302 Other seasonal allergic rhinitis: Secondary | ICD-10-CM

## 2016-08-31 DIAGNOSIS — K353 Acute appendicitis with localized peritonitis: Secondary | ICD-10-CM | POA: Diagnosis present

## 2016-08-31 DIAGNOSIS — J4520 Mild intermittent asthma, uncomplicated: Secondary | ICD-10-CM | POA: Diagnosis present

## 2016-08-31 DIAGNOSIS — E1165 Type 2 diabetes mellitus with hyperglycemia: Secondary | ICD-10-CM | POA: Diagnosis present

## 2016-08-31 HISTORY — PX: LAPAROSCOPIC APPENDECTOMY: SHX408

## 2016-08-31 LAB — CBG MONITORING, ED: Glucose-Capillary: 204 mg/dL — ABNORMAL HIGH (ref 65–99)

## 2016-08-31 LAB — GLUCOSE, CAPILLARY
GLUCOSE-CAPILLARY: 150 mg/dL — AB (ref 65–99)
GLUCOSE-CAPILLARY: 260 mg/dL — AB (ref 65–99)
GLUCOSE-CAPILLARY: 245 mg/dL — AB (ref 65–99)
GLUCOSE-CAPILLARY: 282 mg/dL — AB (ref 65–99)
GLUCOSE-CAPILLARY: 246 mg/dL — AB (ref 65–99)
Glucose-Capillary: 260 mg/dL — ABNORMAL HIGH (ref 65–99)
Glucose-Capillary: 239 mg/dL — ABNORMAL HIGH (ref 65–99)

## 2016-08-31 LAB — SURGICAL PCR SCREEN
MRSA, PCR: NEGATIVE
STAPHYLOCOCCUS AUREUS: NEGATIVE

## 2016-08-31 SURGERY — APPENDECTOMY, LAPAROSCOPIC
Anesthesia: General | Site: Abdomen

## 2016-08-31 MED ORDER — ACETAMINOPHEN 325 MG PO TABS
650.0000 mg | ORAL_TABLET | Freq: Four times a day (QID) | ORAL | Status: DC | PRN
  Administered 2016-09-01 – 2016-09-03 (×9): 650 mg via ORAL
  Filled 2016-08-31 (×10): qty 2

## 2016-08-31 MED ORDER — ONDANSETRON HCL 4 MG/2ML IJ SOLN
INTRAMUSCULAR | Status: DC | PRN
  Administered 2016-08-31: 4 mg via INTRAVENOUS

## 2016-08-31 MED ORDER — PNEUMOCOCCAL VAC POLYVALENT 25 MCG/0.5ML IJ INJ
0.5000 mL | INJECTION | INTRAMUSCULAR | Status: AC
  Administered 2016-09-01: 0.5 mL via INTRAMUSCULAR
  Filled 2016-08-31 (×2): qty 0.5

## 2016-08-31 MED ORDER — LACTATED RINGERS IR SOLN
Status: DC | PRN
Start: 2016-08-31 — End: 2016-08-31
  Administered 2016-08-31: 3000 mL

## 2016-08-31 MED ORDER — NAPROXEN 500 MG PO TABS: 500.0000 mg | ORAL_TABLET | Freq: Two times a day (BID) | ORAL | 1 refills | Status: DC | PRN

## 2016-08-31 MED ORDER — CELECOXIB 400 MG PO CAPS
400.0000 mg | ORAL_CAPSULE | ORAL | Status: AC
  Administered 2016-08-31: 400 mg via ORAL
  Filled 2016-08-31: qty 1

## 2016-08-31 MED ORDER — HYDROMORPHONE HCL 2 MG/ML IJ SOLN: 0.2500 mg | INTRAMUSCULAR | Status: DC | PRN

## 2016-08-31 MED ORDER — ALUM & MAG HYDROXIDE-SIMETH 200-200-20 MG/5ML PO SUSP: 30.0000 mL | Freq: Four times a day (QID) | ORAL | Status: DC | PRN

## 2016-08-31 MED ORDER — CHLORHEXIDINE GLUCONATE CLOTH 2 % EX PADS
6.0000 | MEDICATED_PAD | Freq: Once | CUTANEOUS | Status: AC
  Administered 2016-08-31: 6 via TOPICAL

## 2016-08-31 MED ORDER — SUCCINYLCHOLINE CHLORIDE 200 MG/10ML IV SOSY
PREFILLED_SYRINGE | INTRAVENOUS | Status: AC
  Filled 2016-08-31: qty 10

## 2016-08-31 MED ORDER — BUPIVACAINE HCL (PF) 0.25 % IJ SOLN
INTRAMUSCULAR | Status: AC
  Filled 2016-08-31: qty 60

## 2016-08-31 MED ORDER — DIPHENHYDRAMINE HCL 50 MG/ML IJ SOLN: 12.5000 mg | Freq: Four times a day (QID) | INTRAMUSCULAR | Status: DC | PRN

## 2016-08-31 MED ORDER — METOPROLOL TARTRATE 5 MG/5ML IV SOLN: 5.0000 mg | Freq: Four times a day (QID) | INTRAVENOUS | Status: DC | PRN

## 2016-08-31 MED ORDER — METOCLOPRAMIDE HCL 5 MG/ML IJ SOLN: 5.0000 mg | Freq: Four times a day (QID) | INTRAMUSCULAR | Status: DC | PRN

## 2016-08-31 MED ORDER — LISINOPRIL-HYDROCHLOROTHIAZIDE 20-12.5 MG PO TABS: 1.0000 | ORAL_TABLET | Freq: Every morning | ORAL | Status: DC

## 2016-08-31 MED ORDER — ACETAMINOPHEN 650 MG RE SUPP: 650.0000 mg | Freq: Four times a day (QID) | RECTAL | Status: DC | PRN

## 2016-08-31 MED ORDER — INSULIN ASPART 100 UNIT/ML ~~LOC~~ SOLN
SUBCUTANEOUS | Status: AC
  Filled 2016-08-31: qty 1

## 2016-08-31 MED ORDER — LIDOCAINE 2% (20 MG/ML) 5 ML SYRINGE
INTRAMUSCULAR | Status: AC
  Filled 2016-08-31: qty 5

## 2016-08-31 MED ORDER — LACTATED RINGERS IV SOLN: INTRAVENOUS | Status: DC

## 2016-08-31 MED ORDER — VITAMIN C 500 MG PO TABS
500.0000 mg | ORAL_TABLET | Freq: Two times a day (BID) | ORAL | Status: DC
  Administered 2016-08-31 – 2016-09-03 (×6): 500 mg via ORAL
  Filled 2016-08-31 (×6): qty 1

## 2016-08-31 MED ORDER — SUGAMMADEX SODIUM 500 MG/5ML IV SOLN
INTRAVENOUS | Status: DC | PRN
  Administered 2016-08-31: 300 mg via INTRAVENOUS

## 2016-08-31 MED ORDER — METHOCARBAMOL 1000 MG/10ML IJ SOLN
1000.0000 mg | Freq: Four times a day (QID) | INTRAVENOUS | Status: DC | PRN
  Filled 2016-08-31: qty 10

## 2016-08-31 MED ORDER — HYDRALAZINE HCL 20 MG/ML IJ SOLN: 5.0000 mg | Freq: Four times a day (QID) | INTRAMUSCULAR | Status: DC | PRN

## 2016-08-31 MED ORDER — LIP MEDEX EX OINT
1.0000 "application " | TOPICAL_OINTMENT | Freq: Two times a day (BID) | CUTANEOUS | Status: DC
  Administered 2016-09-01 – 2016-09-03 (×5): 1 via TOPICAL

## 2016-08-31 MED ORDER — HYDROCHLOROTHIAZIDE 12.5 MG PO CAPS
12.5000 mg | ORAL_CAPSULE | Freq: Every day | ORAL | Status: DC
  Administered 2016-08-31 – 2016-09-03 (×4): 12.5 mg via ORAL
  Filled 2016-08-31 (×4): qty 1

## 2016-08-31 MED ORDER — ALBUTEROL SULFATE (2.5 MG/3ML) 0.083% IN NEBU
3.0000 mL | INHALATION_SOLUTION | Freq: Four times a day (QID) | RESPIRATORY_TRACT | Status: DC | PRN
  Administered 2016-08-31: 3 mL via RESPIRATORY_TRACT
  Filled 2016-08-31: qty 3

## 2016-08-31 MED ORDER — LACTATED RINGERS IV SOLN
INTRAVENOUS | Status: DC | PRN
  Administered 2016-08-31: 07:00:00 via INTRAVENOUS

## 2016-08-31 MED ORDER — SIMETHICONE 80 MG PO CHEW: 40.0000 mg | CHEWABLE_TABLET | Freq: Four times a day (QID) | ORAL | Status: DC | PRN

## 2016-08-31 MED ORDER — PHENYLEPHRINE HCL 10 MG/ML IJ SOLN
INTRAMUSCULAR | Status: DC | PRN
  Administered 2016-08-31: 40 ug via INTRAVENOUS

## 2016-08-31 MED ORDER — ACETAMINOPHEN 500 MG PO TABS
1000.0000 mg | ORAL_TABLET | ORAL | Status: AC
  Administered 2016-08-31: 1000 mg via ORAL
  Filled 2016-08-31: qty 2

## 2016-08-31 MED ORDER — PIPERACILLIN-TAZOBACTAM 3.375 G IVPB
3.3750 g | Freq: Three times a day (TID) | INTRAVENOUS | Status: DC
  Administered 2016-08-31: 3.375 g via INTRAVENOUS
  Filled 2016-08-31 (×2): qty 50

## 2016-08-31 MED ORDER — KETOROLAC TROMETHAMINE 15 MG/ML IJ SOLN
15.0000 mg | Freq: Four times a day (QID) | INTRAMUSCULAR | Status: DC | PRN
  Administered 2016-09-01: 15 mg via INTRAVENOUS
  Filled 2016-08-31: qty 2
  Filled 2016-08-31: qty 1

## 2016-08-31 MED ORDER — MORPHINE SULFATE (PF) 4 MG/ML IV SOLN
4.0000 mg | Freq: Once | INTRAVENOUS | Status: AC
  Administered 2016-08-31: 4 mg via INTRAVENOUS
  Filled 2016-08-31: qty 1

## 2016-08-31 MED ORDER — ASPIRIN-ACETAMINOPHEN-CAFFEINE 250-250-65 MG PO TABS
1.0000 | ORAL_TABLET | Freq: Four times a day (QID) | ORAL | Status: DC | PRN
  Filled 2016-08-31: qty 1

## 2016-08-31 MED ORDER — PROPOFOL 10 MG/ML IV BOLUS
INTRAVENOUS | Status: DC | PRN
  Administered 2016-08-31: 160 mg via INTRAVENOUS

## 2016-08-31 MED ORDER — SIMVASTATIN 40 MG PO TABS
40.0000 mg | ORAL_TABLET | Freq: Every evening | ORAL | Status: DC
  Administered 2016-08-31 – 2016-09-02 (×3): 40 mg via ORAL
  Filled 2016-08-31: qty 2
  Filled 2016-08-31 (×2): qty 1

## 2016-08-31 MED ORDER — INFLUENZA VAC SPLIT QUAD 0.5 ML IM SUSY
0.5000 mL | PREFILLED_SYRINGE | INTRAMUSCULAR | Status: AC
  Administered 2016-09-01: 0.5 mL via INTRAMUSCULAR
  Filled 2016-08-31 (×2): qty 0.5

## 2016-08-31 MED ORDER — 0.9 % SODIUM CHLORIDE (POUR BTL) OPTIME
TOPICAL | Status: DC | PRN
  Administered 2016-08-31: 1000 mL

## 2016-08-31 MED ORDER — METHOCARBAMOL 500 MG PO TABS
1000.0000 mg | ORAL_TABLET | Freq: Four times a day (QID) | ORAL | Status: DC | PRN
  Administered 2016-09-01 – 2016-09-03 (×7): 1000 mg via ORAL
  Filled 2016-08-31 (×7): qty 2

## 2016-08-31 MED ORDER — SUCCINYLCHOLINE CHLORIDE 20 MG/ML IJ SOLN
INTRAMUSCULAR | Status: DC | PRN
  Administered 2016-08-31: 120 mg via INTRAVENOUS

## 2016-08-31 MED ORDER — MAGIC MOUTHWASH
15.0000 mL | Freq: Four times a day (QID) | ORAL | Status: DC | PRN
  Filled 2016-08-31: qty 15

## 2016-08-31 MED ORDER — BISACODYL 10 MG RE SUPP: 10.0000 mg | Freq: Two times a day (BID) | RECTAL | Status: DC | PRN

## 2016-08-31 MED ORDER — METOPROLOL TARTRATE 12.5 MG HALF TABLET: 12.5000 mg | ORAL_TABLET | Freq: Two times a day (BID) | ORAL | Status: DC | PRN

## 2016-08-31 MED ORDER — ROCURONIUM BROMIDE 50 MG/5ML IV SOSY
PREFILLED_SYRINGE | INTRAVENOUS | Status: AC
  Filled 2016-08-31: qty 5

## 2016-08-31 MED ORDER — CHLORHEXIDINE GLUCONATE CLOTH 2 % EX PADS: 6.0000 | MEDICATED_PAD | Freq: Once | CUTANEOUS | Status: DC

## 2016-08-31 MED ORDER — PROMETHAZINE HCL 25 MG/ML IJ SOLN: 6.2500 mg | INTRAMUSCULAR | Status: DC | PRN

## 2016-08-31 MED ORDER — PIPERACILLIN-TAZOBACTAM 3.375 G IVPB
3.3750 g | Freq: Three times a day (TID) | INTRAVENOUS | Status: DC
  Administered 2016-08-31 – 2016-09-03 (×9): 3.375 g via INTRAVENOUS
  Filled 2016-08-31 (×8): qty 50

## 2016-08-31 MED ORDER — HYDROMORPHONE HCL 2 MG/ML IJ SOLN
0.5000 mg | INTRAMUSCULAR | Status: DC | PRN
  Administered 2016-08-31 (×3): 1 mg via INTRAVENOUS
  Administered 2016-08-31: 2 mg via INTRAVENOUS
  Filled 2016-08-31 (×4): qty 1

## 2016-08-31 MED ORDER — GLIMEPIRIDE 4 MG PO TABS
4.0000 mg | ORAL_TABLET | Freq: Two times a day (BID) | ORAL | Status: DC
  Administered 2016-08-31 – 2016-09-03 (×7): 4 mg via ORAL
  Filled 2016-08-31 (×7): qty 1

## 2016-08-31 MED ORDER — PHENYLEPHRINE 40 MCG/ML (10ML) SYRINGE FOR IV PUSH (FOR BLOOD PRESSURE SUPPORT)
PREFILLED_SYRINGE | INTRAVENOUS | Status: AC
  Filled 2016-08-31: qty 10

## 2016-08-31 MED ORDER — LIDOCAINE HCL (CARDIAC) 20 MG/ML IV SOLN
INTRAVENOUS | Status: DC | PRN
  Administered 2016-08-31: 60 mg via INTRAVENOUS

## 2016-08-31 MED ORDER — SUGAMMADEX SODIUM 500 MG/5ML IV SOLN
INTRAVENOUS | Status: AC
  Filled 2016-08-31: qty 5

## 2016-08-31 MED ORDER — INSULIN ASPART 100 UNIT/ML ~~LOC~~ SOLN
5.0000 [IU] | Freq: Once | SUBCUTANEOUS | Status: AC
  Administered 2016-08-31: 5 [IU] via SUBCUTANEOUS

## 2016-08-31 MED ORDER — BUPIVACAINE HCL (PF) 0.25 % IJ SOLN
INTRAMUSCULAR | Status: DC | PRN
  Administered 2016-08-31: 60 mL

## 2016-08-31 MED ORDER — LACTATED RINGERS IV SOLN
INTRAVENOUS | Status: DC
  Administered 2016-08-31: 03:00:00 via INTRAVENOUS

## 2016-08-31 MED ORDER — ONDANSETRON HCL 4 MG/2ML IJ SOLN
4.0000 mg | Freq: Four times a day (QID) | INTRAMUSCULAR | Status: DC | PRN
  Administered 2016-08-31 (×3): 4 mg via INTRAVENOUS
  Filled 2016-08-31 (×3): qty 2

## 2016-08-31 MED ORDER — ENOXAPARIN SODIUM 40 MG/0.4ML ~~LOC~~ SOLN
40.0000 mg | SUBCUTANEOUS | Status: DC
  Administered 2016-09-01 – 2016-09-02 (×2): 40 mg via SUBCUTANEOUS
  Filled 2016-08-31 (×2): qty 0.4

## 2016-08-31 MED ORDER — LACTATED RINGERS IV SOLN
INTRAVENOUS | Status: DC
  Administered 2016-08-31 – 2016-09-02 (×3): via INTRAVENOUS

## 2016-08-31 MED ORDER — LACTATED RINGERS IV BOLUS (SEPSIS): 1000.0000 mL | Freq: Once | INTRAVENOUS | Status: DC

## 2016-08-31 MED ORDER — LACTATED RINGERS IV BOLUS (SEPSIS): 1000.0000 mL | Freq: Three times a day (TID) | INTRAVENOUS | Status: AC | PRN

## 2016-08-31 MED ORDER — FENTANYL CITRATE (PF) 100 MCG/2ML IJ SOLN
INTRAMUSCULAR | Status: DC | PRN
  Administered 2016-08-31 (×2): 50 ug via INTRAVENOUS

## 2016-08-31 MED ORDER — FENTANYL CITRATE (PF) 250 MCG/5ML IJ SOLN
INTRAMUSCULAR | Status: AC
  Filled 2016-08-31: qty 5

## 2016-08-31 MED ORDER — PROCHLORPERAZINE EDISYLATE 5 MG/ML IJ SOLN
5.0000 mg | INTRAMUSCULAR | Status: DC | PRN
  Administered 2016-08-31: 10 mg via INTRAVENOUS
  Filled 2016-08-31: qty 2

## 2016-08-31 MED ORDER — GABAPENTIN 300 MG PO CAPS
300.0000 mg | ORAL_CAPSULE | ORAL | Status: AC
  Administered 2016-08-31: 300 mg via ORAL
  Filled 2016-08-31: qty 1

## 2016-08-31 MED ORDER — TRAMADOL HCL 50 MG PO TABS: 50.0000 mg | ORAL_TABLET | Freq: Four times a day (QID) | ORAL | Status: DC | PRN

## 2016-08-31 MED ORDER — ONDANSETRON HCL 4 MG/2ML IJ SOLN
INTRAMUSCULAR | Status: AC
  Filled 2016-08-31: qty 2

## 2016-08-31 MED ORDER — INSULIN ASPART 100 UNIT/ML ~~LOC~~ SOLN
0.0000 [IU] | SUBCUTANEOUS | Status: DC
  Administered 2016-08-31: 5 [IU] via SUBCUTANEOUS
  Administered 2016-08-31: 8 [IU] via SUBCUTANEOUS
  Administered 2016-08-31: 5 [IU] via SUBCUTANEOUS
  Administered 2016-08-31: 2 [IU] via SUBCUTANEOUS
  Administered 2016-08-31: 5 [IU] via SUBCUTANEOUS
  Administered 2016-09-01: 2 [IU] via SUBCUTANEOUS
  Administered 2016-09-01: 3 [IU] via SUBCUTANEOUS
  Administered 2016-09-01: 5 [IU] via SUBCUTANEOUS
  Filled 2016-08-31: qty 1

## 2016-08-31 MED ORDER — IPRATROPIUM-ALBUTEROL 0.5-2.5 (3) MG/3ML IN SOLN
3.0000 mL | Freq: Two times a day (BID) | RESPIRATORY_TRACT | Status: DC
  Administered 2016-08-31 – 2016-09-03 (×6): 3 mL via RESPIRATORY_TRACT
  Filled 2016-08-31 (×6): qty 3

## 2016-08-31 MED ORDER — DIPHENHYDRAMINE HCL 12.5 MG/5ML PO ELIX: 12.5000 mg | ORAL_SOLUTION | Freq: Four times a day (QID) | ORAL | Status: DC | PRN

## 2016-08-31 MED ORDER — MEPERIDINE HCL 50 MG/ML IJ SOLN: 6.2500 mg | INTRAMUSCULAR | Status: DC | PRN

## 2016-08-31 MED ORDER — DEXAMETHASONE SODIUM PHOSPHATE 10 MG/ML IJ SOLN
INTRAMUSCULAR | Status: AC
  Filled 2016-08-31: qty 1

## 2016-08-31 MED ORDER — LISINOPRIL 20 MG PO TABS
20.0000 mg | ORAL_TABLET | Freq: Every day | ORAL | Status: DC
  Administered 2016-08-31 – 2016-09-03 (×4): 20 mg via ORAL
  Filled 2016-08-31 (×4): qty 1

## 2016-08-31 MED ORDER — DEXAMETHASONE SODIUM PHOSPHATE 10 MG/ML IJ SOLN
INTRAMUSCULAR | Status: DC | PRN
  Administered 2016-08-31: 4 mg via INTRAVENOUS

## 2016-08-31 MED ORDER — AMOXICILLIN-POT CLAVULANATE 875-125 MG PO TABS: 1.0000 | ORAL_TABLET | Freq: Two times a day (BID) | ORAL | 1 refills | Status: DC

## 2016-08-31 MED ORDER — PROPOFOL 10 MG/ML IV BOLUS
INTRAVENOUS | Status: AC
  Filled 2016-08-31: qty 20

## 2016-08-31 MED ORDER — MENTHOL 3 MG MT LOZG: 1.0000 | LOZENGE | OROMUCOSAL | Status: DC | PRN

## 2016-08-31 MED ORDER — TRAMADOL HCL 50 MG PO TABS: 50.0000 mg | ORAL_TABLET | Freq: Four times a day (QID) | ORAL | 0 refills | Status: DC | PRN

## 2016-08-31 MED ORDER — PHENOL 1.4 % MT LIQD: 2.0000 | OROMUCOSAL | Status: DC | PRN

## 2016-08-31 MED ORDER — ROCURONIUM BROMIDE 100 MG/10ML IV SOLN
INTRAVENOUS | Status: DC | PRN
  Administered 2016-08-31: 30 mg via INTRAVENOUS
  Administered 2016-08-31: 10 mg via INTRAVENOUS

## 2016-08-31 MED ORDER — LORATADINE 10 MG PO TABS
10.0000 mg | ORAL_TABLET | Freq: Every day | ORAL | Status: DC
  Administered 2016-09-01 – 2016-09-03 (×3): 10 mg via ORAL
  Filled 2016-08-31 (×3): qty 1

## 2016-08-31 MED ORDER — ONDANSETRON 4 MG PO TBDP: 4.0000 mg | ORAL_TABLET | Freq: Four times a day (QID) | ORAL | Status: DC | PRN

## 2016-08-31 SURGICAL SUPPLY — 48 items
APPLIER CLIP 5 13 M/L LIGAMAX5 (MISCELLANEOUS)
APPLIER CLIP ROT 10 11.4 M/L (STAPLE)
APR CLP MED LRG 11.4X10 (STAPLE)
APR CLP MED LRG 5 ANG JAW (MISCELLANEOUS)
BAG SPEC RTRVL LRG 6X4 10 (ENDOMECHANICALS) ×1
CABLE HIGH FREQUENCY MONO STRZ (ELECTRODE) ×3 IMPLANT
CHLORAPREP W/TINT 26ML (MISCELLANEOUS) ×3 IMPLANT
CLIP APPLIE 5 13 M/L LIGAMAX5 (MISCELLANEOUS) IMPLANT
CLIP APPLIE ROT 10 11.4 M/L (STAPLE) IMPLANT
COVER SURGICAL LIGHT HANDLE (MISCELLANEOUS) ×3 IMPLANT
CUTTER FLEX LINEAR 45M (STAPLE) ×2 IMPLANT
DECANTER SPIKE VIAL GLASS SM (MISCELLANEOUS) ×3 IMPLANT
DEVICE TROCAR PUNCTURE CLOSURE (ENDOMECHANICALS) ×2 IMPLANT
DRAPE LAPAROSCOPIC ABDOMINAL (DRAPES) ×3 IMPLANT
DRAPE WARM FLUID 44X44 (DRAPE) ×3 IMPLANT
DRSG TEGADERM 2-3/8X2-3/4 SM (GAUZE/BANDAGES/DRESSINGS) ×6 IMPLANT
DRSG TEGADERM 4X4.75 (GAUZE/BANDAGES/DRESSINGS) ×3 IMPLANT
ELECT REM PT RETURN 9FT ADLT (ELECTROSURGICAL) ×3
ELECTRODE REM PT RTRN 9FT ADLT (ELECTROSURGICAL) ×1 IMPLANT
ENDOLOOP SUT PDS II  0 18 (SUTURE)
ENDOLOOP SUT PDS II 0 18 (SUTURE) IMPLANT
GAUZE SPONGE 2X2 8PLY STRL LF (GAUZE/BANDAGES/DRESSINGS) ×1 IMPLANT
GLOVE ECLIPSE 8.0 STRL XLNG CF (GLOVE) ×3 IMPLANT
GLOVE INDICATOR 8.0 STRL GRN (GLOVE) ×3 IMPLANT
GOWN STRL REUS W/TWL XL LVL3 (GOWN DISPOSABLE) ×6 IMPLANT
IRRIG SUCT STRYKERFLOW 2 WTIP (MISCELLANEOUS) ×3
IRRIGATION SUCT STRKRFLW 2 WTP (MISCELLANEOUS) ×1 IMPLANT
KIT BASIN OR (CUSTOM PROCEDURE TRAY) ×3 IMPLANT
PAD POSITIONING PINK XL (MISCELLANEOUS) ×3 IMPLANT
POUCH SPECIMEN RETRIEVAL 10MM (ENDOMECHANICALS) ×3 IMPLANT
RELOAD 45 VASCULAR/THIN (ENDOMECHANICALS) IMPLANT
RELOAD STAPLE 45 2.5 WHT GRN (ENDOMECHANICALS) IMPLANT
RELOAD STAPLE 45 3.5 BLU ETS (ENDOMECHANICALS) IMPLANT
RELOAD STAPLE TA45 3.5 REG BLU (ENDOMECHANICALS) ×3 IMPLANT
SCISSORS LAP 5X35 DISP (ENDOMECHANICALS) ×3 IMPLANT
SHEARS HARMONIC ACE PLUS 36CM (ENDOMECHANICALS) ×2 IMPLANT
SLEEVE XCEL OPT CAN 5 100 (ENDOMECHANICALS) ×3 IMPLANT
SPONGE GAUZE 2X2 STER 10/PKG (GAUZE/BANDAGES/DRESSINGS) ×2
SUT MNCRL AB 4-0 PS2 18 (SUTURE) ×3 IMPLANT
SUT PDS AB 0 CT1 36 (SUTURE) IMPLANT
SUT PDS AB 1 CT1 27 (SUTURE) IMPLANT
SUT SILK 2 0 SH (SUTURE) IMPLANT
TOWEL OR 17X26 10 PK STRL BLUE (TOWEL DISPOSABLE) ×3 IMPLANT
TRAY FOLEY W/METER SILVER 16FR (SET/KITS/TRAYS/PACK) ×2 IMPLANT
TRAY LAPAROSCOPIC (CUSTOM PROCEDURE TRAY) ×3 IMPLANT
TROCAR BLADELESS OPT 5 100 (ENDOMECHANICALS) ×3 IMPLANT
TROCAR XCEL 12X100 BLDLESS (ENDOMECHANICALS) ×3 IMPLANT
TUBING INSUF HEATED (TUBING) ×3 IMPLANT

## 2016-08-31 NOTE — Addendum Note (Signed)
Addendum  created 08/31/16 0958 by Thornell MuleHoward G Jazzmyn Filion, CRNA   Anesthesia Intra LDAs edited, LDA properties accepted

## 2016-08-31 NOTE — Anesthesia Postprocedure Evaluation (Signed)
Anesthesia Post Note  Patient: Cassandra Gould  Procedure(s) Performed: Procedure(s) (LRB): APPENDECTOMY LAPAROSCOPIC (N/A)  Patient location during evaluation: PACU Anesthesia Type: General Level of consciousness: awake and alert Pain management: pain level controlled Vital Signs Assessment: post-procedure vital signs reviewed and stable Respiratory status: spontaneous breathing, nonlabored ventilation, respiratory function stable and patient connected to nasal cannula oxygen Cardiovascular status: blood pressure returned to baseline and stable Postop Assessment: no signs of nausea or vomiting Anesthetic complications: no    Last Vitals:  Vitals:   08/31/16 0915 08/31/16 0930  BP: (!) 148/63 (!) 128/104  Pulse: (!) 110 (!) 109  Resp: 20 17  Temp:      Last Pain:  Vitals:   08/31/16 0630  TempSrc: Oral  PainSc:                  Shelton SilvasKevin D Rubie Ficco

## 2016-08-31 NOTE — Anesthesia Procedure Notes (Signed)
Procedure Name: Intubation Date/Time: 08/31/2016 7:48 AM Performed by: Thornell MuleSTUBBLEFIELD, Zaela Graley G Pre-anesthesia Checklist: Patient identified, Emergency Drugs available, Suction available and Patient being monitored Patient Re-evaluated:Patient Re-evaluated prior to inductionOxygen Delivery Method: Circle system utilized Preoxygenation: Pre-oxygenation with 100% oxygen Intubation Type: IV induction Ventilation: Mask ventilation without difficulty Laryngoscope Size: Miller and 3 Grade View: Grade I Tube type: Oral Tube size: 7.0 mm Number of attempts: 1 Airway Equipment and Method: Stylet and Oral airway Placement Confirmation: ETT inserted through vocal cords under direct vision,  positive ETCO2 and breath sounds checked- equal and bilateral Secured at: 20 cm Tube secured with: Tape Dental Injury: Teeth and Oropharynx as per pre-operative assessment

## 2016-08-31 NOTE — Discharge Instructions (Signed)
LAPAROSCOPIC SURGERY: POST OP INSTRUCTIONS  ######################################################################  EAT Gradually transition to a high fiber diet with a fiber supplement over the next few weeks after discharge.  Start with a pureed / full liquid diet (see below)  WALK Walk an hour a day.  Control your pain to do that.    CONTROL PAIN Control pain so that you can walk, sleep, tolerate sneezing/coughing, go up/down stairs.  HAVE A BOWEL MOVEMENT DAILY Keep your bowels regular to avoid problems.  OK to try a laxative to override constipation.  OK to use an antidairrheal to slow down diarrhea.  Call if not better after 2 tries  CALL IF YOU HAVE PROBLEMS/CONCERNS Call if you are still struggling despite following these instructions. Call if you have concerns not answered by these instructions  ######################################################################    1. DIET: Follow a light bland diet the first 24 hours after arrival home, such as soup, liquids, crackers, etc.  Be sure to include lots of fluids daily.  Avoid fast food or heavy meals as your are more likely to get nauseated.  Eat a low fat the next few days after surgery.   2. Take your usually prescribed home medications unless otherwise directed. 3. PAIN CONTROL: a. Pain is best controlled by a usual combination of three different methods TOGETHER: i. Ice/Heat ii. Over the counter pain medication iii. Prescription pain medication b. Most patients will experience some swelling and bruising around the incisions.  Ice packs or heating pads (30-60 minutes up to 6 times a day) will help. Use ice for the first few days to help decrease swelling and bruising, then switch to heat to help relax tight/sore spots and speed recovery.  Some people prefer to use ice alone, heat alone, alternating between ice & heat.  Experiment to what works for you.  Swelling and bruising can take several weeks to resolve.   c. It is  helpful to take an over-the-counter pain medication regularly for the first few weeks.  Choose one of the following that works best for you: i. Naproxen (Aleve, etc)  Two 251m tabs twice a day ii. Ibuprofen (Advil, etc) Three 2053mtabs four times a day (every meal & bedtime) iii. Acetaminophen (Tylenol, etc) 500-65044mour times a day (every meal & bedtime) d. A  prescription for pain medication (such as oxycodone, hydrocodone, etc) should be given to you upon discharge.  Take your pain medication as prescribed.  i. If you are having problems/concerns with the prescription medicine (does not control pain, nausea, vomiting, rash, itching, etc), please call us [phone] see if we need to switch you to a different pain medicine that will work better for you and/or control your side effect better. ii. If you need a refill on your pain medication, please contact your pharmacy.  They will contact our office to request authorization. Prescriptions will not be filled after 5 pm or on week-ends. 4. Avoid getting constipated.  Between the surgery and the pain medications, it is common to experience some constipation.  Increasing fluid intake and taking a fiber supplement (such as Metamucil, Citrucel, FiberCon, MiraLax, etc) 1-2 times a day regularly will usually help prevent this problem from occurring.  A mild laxative (prune juice, Milk of Magnesia, MiraLax, etc) should be taken according to package directions if there are no bowel movements after 48 hours.   5. Watch out for diarrhea.  If you have many loose bowel movements, simplify your diet to bland foods & liquids for  a few days.  Stop any stool softeners and decrease your fiber supplement.  Switching to mild anti-diarrheal medications (Kayopectate, Pepto Bismol) can help.  If this worsens or does not improve, please call us. °6. Wash / shower every day.  You may shower over the dressings as they are waterproof.  Continue to shower over incision(s)  after the dressing is off. °7. Remove your waterproof bandages 5 days after surgery.  You may leave the incision open to air.  You may replace a dressing/Band-Aid to cover the incision for comfort if you wish.  °8. ACTIVITIES as tolerated:   °a. You may resume regular (light) daily activities beginning the next day--such as daily self-care, walking, climbing stairs--gradually increasing activities as tolerated.  If you can walk 30 minutes without difficulty, it is safe to try more intense activity such as jogging, treadmill, bicycling, low-impact aerobics, swimming, etc. °b. Save the most intensive and strenuous activity for last such as sit-ups, heavy lifting, contact sports, etc  Refrain from any heavy lifting or straining until you are off narcotics for pain control.   °c. DO NOT PUSH THROUGH PAIN.  Let pain be your guide: If it hurts to do something, don't do it.  Pain is your body warning you to avoid that activity for another week until the pain goes down. °d. You may drive when you are no longer taking prescription pain medication, you can comfortably wear a seatbelt, and you can safely maneuver your car and apply brakes. °e. You may have sexual intercourse when it is comfortable.  °9. FOLLOW UP in our office °a. Please call [facility] at [phone] to set up an appointment to see your surgeon in the office for a follow-up appointment approximately 2-3 weeks after your surgery. °b. Make sure that you call for this appointment the day you arrive home to insure a convenient appointment time. °10. IF YOU HAVE DISABILITY OR FAMILY LEAVE FORMS, BRING THEM TO THE OFFICE FOR PROCESSING.  DO NOT GIVE THEM TO YOUR DOCTOR. ° ° °WHEN TO CALL US [phone]: °1. Poor pain control °2. Reactions / problems with new medications (rash/itching, nausea, etc)  °3. Fever over 101.5 F (38.5 C) °4. Inability to urinate °5. Nausea and/or vomiting °6. Worsening swelling or bruising °7. Continued bleeding from incision. °8. Increased  pain, redness, or drainage from the incision ° ° The clinic staff is available to answer your questions during regular business hours (8:30am-5pm).  Please don’t hesitate to call and ask to speak to one of our nurses for clinical concerns.  ° If you have a medical emergency, go to the nearest emergency room or call 911. ° A surgeon from [facility] is always on call at the hospitals ° ° °[facility] °[address], [address], [place], [place]  [address] ? °MAIN: [phone] ? TOLL FREE: [phone] ?  °FAX [phone] °[email or web address] ° ° ° °Appendicitis °The appendix is a finger-shaped tube that is attached to the large intestine. Appendicitis is inflammation of the appendix. Without treatment, appendicitis can cause the appendix to tear (rupture). A ruptured appendix can lead to a life-threatening infection. It can also lead to the formation of a painful collection of pus (abscess) in the appendix. °What are the causes? °This condition may be caused by a blockage in the appendix that leads to infection. The blockage can be due to: °· A ball of stool. °· Enlarged lymph glands. ° °In some cases, the cause may   not be known. What increases the risk? This condition is more likely to develop in people who are 610-60 years of age. What are the signs or symptoms? Symptoms of this condition include:  Pain around the belly button that moves toward the lower right abdomen. The pain can become more severe as time passes. It gets worse with coughing or sudden movements.  Tenderness in the lower right abdomen.  Nausea.  Vomiting.  Loss of appetite.  Fever.  Constipation.  Diarrhea.  Generally not feeling well. How is this diagnosed? This condition may be diagnosed with:  A physical exam.  Blood tests.  Urine test. To confirm the diagnosis, an ultrasound, MRI, or CT scan may be done. How is this treated? This condition is usually treated  by taking out the appendix (appendectomy). There are two methods for doing an appendectomy:  Open appendectomy. In this surgery, the appendix is removed through a large cut (incision) that is made in the lower right abdomen. This procedure may be recommended if:  You have major scarring from a previous surgery.  You have a bleeding disorder.  You are pregnant and are near term.  You have a condition that makes the laparoscopic procedure impossible, such as an advanced infection or a ruptured appendix.  Laparoscopic appendectomy. In this surgery, the appendix is removed through small incisions. This procedure usually causes less pain and fewer problems than an open appendectomy. It also has a shorter recovery time. If the appendix has ruptured and an abscess has formed, a drain may be placed into the abscess to remove fluid and antibiotic medicines may be given through an IV tube. The appendix may or may not need to be removed. This information is not intended to replace advice given to you by your health care provider. Make sure you discuss any questions you have with your health care provider. Document Released: 09/01/2005 Document Revised: 01/09/2016 Document Reviewed: 01/17/2015 Elsevier Interactive Patient Education  2017 [employer]    Diabetes Mellitus and Exercise Exercising regularly is important for your overall health, especially when you have diabetes (diabetes mellitus). Exercising is not only about losing weight. It has many health benefits, such as increasing muscle strength and bone density and reducing body fat and stress. This leads to improved fitness, flexibility, and endurance, all of which result in better overall health. Exercise has additional benefits for people with diabetes, including:  Reducing appetite.  Helping to lower and control blood glucose.  Lowering blood pressure.  Helping to control amounts of fatty substances (lipids) in the blood, such as  cholesterol and triglycerides.  Helping the body to respond better to insulin (improving insulin sensitivity).  Reducing how much insulin the body needs.  Decreasing the risk for heart disease by:  Lowering cholesterol and triglyceride levels.  Increasing the levels of good cholesterol.  Lowering blood glucose levels. What is my activity plan? Your health care provider or certified diabetes educator can help you make a plan for the type and frequency of exercise (activity plan) that works for you. Make sure that you:  Do at least 150 minutes of moderate-intensity or vigorous-intensity exercise each week. This could be brisk walking, biking, or water aerobics.  Do stretching and strength exercises, such as yoga or weightlifting, at least 2 times a week.  Spread out your activity over at least 3 days of the week.  Get some form of physical activity every day.  Do not go more than 2 days in a row without some  kind of physical activity.  Avoid being inactive for more than 90 minutes at a time. Take frequent breaks to walk or stretch.  Choose a type of exercise or activity that you enjoy, and set realistic goals.  Start slowly, and gradually increase the intensity of your exercise over time. What do I need to know about managing my diabetes?  Check your blood glucose before and after exercising.  If your blood glucose is higher than 240 mg/dL (11.913.3 mmol/L) before you exercise, check your urine for ketones. If you have ketones in your urine, do not exercise until your blood glucose returns to normal.  Know the symptoms of low blood glucose (hypoglycemia) and how to treat it. Your risk for hypoglycemia increases during and after exercise. Common symptoms of hypoglycemia can include:  Hunger.  Anxiety.  Sweating and feeling clammy.  Confusion.  Dizziness or feeling light-headed.  Increased heart rate or palpitations.  Blurry vision.  Tingling or numbness around the mouth,  lips, or tongue.  Tremors or shakes.  Irritability.  Keep a rapid-acting carbohydrate snack available before, during, and after exercise to help prevent or treat hypoglycemia.  Avoid injecting insulin into areas of the body that are going to be exercised. For example, avoid injecting insulin into:  The arms, when playing tennis.  The legs, when jogging.  Keep records of your exercise habits. Doing this can help you and your health care provider adjust your diabetes management plan as needed. Write down:  Food that you eat before and after you exercise.  Blood glucose levels before and after you exercise.  The type and amount of exercise you have done.  When your insulin is expected to peak, if you use insulin. Avoid exercising at times when your insulin is peaking.  When you start a new exercise or activity, work with your health care provider to make sure the activity is safe for you, and to adjust your insulin, medicines, or food intake as needed.  Drink plenty of water while you exercise to prevent dehydration or heat stroke. Drink enough fluid to keep your urine clear or pale yellow. This information is not intended to replace advice given to you by your health care provider. Make sure you discuss any questions you have with your health care provider. Document Released: 11/22/2003 Document Revised: 03/21/2016 Document Reviewed: 02/11/2016 Elsevier Interactive Patient Education  2017 [employer]

## 2016-08-31 NOTE — Anesthesia Preprocedure Evaluation (Addendum)
Anesthesia Evaluation  Patient identified by MRN, date of birth, ID band Patient awake    Reviewed: Allergy & Precautions, NPO status , Patient's Chart, lab work & pertinent test results  Airway Mallampati: II  TM Distance: >3 FB Neck ROM: Full    Dental  (+) Edentulous Upper, Dental Advisory Given   Pulmonary asthma , COPD,  COPD inhaler, former smoker,    + rhonchi        Cardiovascular hypertension, Pt. on medications  Rhythm:Regular Rate:Normal     Neuro/Psych  Headaches, negative psych ROS   GI/Hepatic negative GI ROS, Neg liver ROS,   Endo/Other  diabetes, Type 2, Oral Hypoglycemic Agents  Renal/GU negative Renal ROS  negative genitourinary   Musculoskeletal negative musculoskeletal ROS (+)   Abdominal   Peds negative pediatric ROS (+)  Hematology negative hematology ROS (+)   Anesthesia Other Findings   Reproductive/Obstetrics negative OB ROS                             Lab Results  Component Value Date   WBC 14.5 (H) 08/30/2016   HGB 10.4 (L) 08/30/2016   HCT 31.8 (L) 08/30/2016   MCV 73.1 (L) 08/30/2016   PLT 256 08/30/2016   Lab Results  Component Value Date   CREATININE 0.86 08/30/2016   BUN 17 08/30/2016   NA 135 08/30/2016   K 4.0 08/30/2016   CL 100 (L) 08/30/2016   CO2 24 08/30/2016   Lab Results  Component Value Date   INR 0.9 12/21/2008   08/2016 EKG: SB.   Anesthesia Physical Anesthesia Plan  ASA: III  Anesthesia Plan: General   Post-op Pain Management:    Induction: Intravenous, Rapid sequence and Cricoid pressure planned  Airway Management Planned: Oral ETT  Additional Equipment:   Intra-op Plan:   Post-operative Plan: Extubation in OR  Informed Consent: I have reviewed the patients History and Physical, chart, labs and discussed the procedure including the risks, benefits and alternatives for the proposed anesthesia with the patient or  authorized representative who has indicated his/her understanding and acceptance.   Dental advisory given  Plan Discussed with: CRNA  Anesthesia Plan Comments:         Anesthesia Quick Evaluation

## 2016-08-31 NOTE — Progress Notes (Signed)
Pharmacy Antibiotic Note  Linde GillisShirley Vantine is a 60 y.o. female c/o abdominal pain admitted on 08/30/2016 with intra-abdominal infection.  Pharmacy has been consulted for zosyn dosing.  Plan: Zosyn 3.375g IV q8h (4 hour infusion).  Height: 5\' 11"  (180.3 cm) Weight: 280 lb (127 kg) IBW/kg (Calculated) : 70.8  Temp (24hrs), Avg:101.2 F (38.4 C), Min:99.1 F (37.3 C), Max:103.1 F (39.5 C)   Recent Labs Lab 08/30/16 1701 08/30/16 1714 08/30/16 2126  WBC 14.5*  --   --   CREATININE 0.86  --   --   LATICACIDVEN  --  1.40 1.24    Estimated Creatinine Clearance: 102.5 mL/min (by C-G formula based on SCr of 0.86 mg/dL).    Allergies  Allergen Reactions  . Oxycodone Itching, Swelling and Rash    Antimicrobials this admission: 12/17  zosyn >>    >>   Dose adjustments this admission:   Microbiology results:  BCx:   UCx:    Sputum:    MRSA PCR:   Thank you for allowing pharmacy to be a part of this patient's care.  Lorenza EvangelistGreen, Meredyth Hornung R 08/31/2016 1:31 AM

## 2016-08-31 NOTE — Op Note (Signed)
PATIENT:  Cassandra GillisShirley Gould  60 y.o. female  Patient Care Team: Fleet ContrasEdwin Avbuere, MD as PCP - General (Internal Medicine) Runell GessJonathan J Berry, MD as Consulting Physician (Cardiology)  PRE-OPERATIVE DIAGNOSIS:  acute appendicitis  POST-OPERATIVE DIAGNOSIS:  Perforated appendicitis  PROCEDURE:  LAPAROSCOPIC APPENDECTOMY  SURGEON:  Ardeth SportsmanSteven C. Lavena Loretto, MD  ANESTHESIA:   local and general  EBL:  No intake/output data recorded.  Delay start of Pharmacological VTE agent (>24hrs) due to surgical blood loss or risk of bleeding:  no  DRAINS: none   SPECIMEN:   APPENDIX  DISPOSITION OF SPECIMEN:  PATHOLOGY  COUNTS:  YES  PLAN OF CARE: Admit to inpatient   PATIENT DISPOSITION:  PACU - hemodynamically stable.   INDICATIONS: Patient with concerning symptoms & work up suspicious for appendicitis.  Surgery was recommended:  The anatomy & physiology of the digestive tract was discussed.  The pathophysiology of appendicitis was discussed.  Natural history risks without surgery was discussed.   I feel the risks of no intervention will lead to serious problems that outweigh the operative risks; therefore, I recommended diagnostic laparoscopy with removal of appendix to remove the pathology.  Laparoscopic & open techniques were discussed.   I noted a good likelihood this will help address the problem.    Risks such as bleeding, infection, abscess, leak, reoperation, possible ostomy, hernia, heart attack, death, and other risks were discussed.  Goals of post-operative recovery were discussed as well.  We will work to minimize complications.  Questions were answered.  The patient expresses understanding & wishes to proceed with surgery.  OR FINDINGS: Inflamed thickened appendix with tip perforated on dome of bladder near uterus and right adnexa.  Some phlegmon there.  Some interloop adhesions.  Focal peritonitis.  Consistent more with an anterior perforation.  Not retrocecal.  No discrete  abscess.`  DESCRIPTION:   The patient was identified & brought into the operating room. The patient was positioned supine with arms tucked. SCDs were active during the entire case. The patient underwent general anesthesia without any difficulty.  The abdomen was prepped and draped in a sterile fashion. A Surgical Timeout confirmed our plan.  The patient was positioned in reverse Trendelenburg.  Abdominal entry with a 5mm laparoscopic port was gained using optical entry technique in the left upper abdomen.  Entry was clean.  I induced carbon dioxide insufflation.  Camera inspection revealed no injury.  Extra ports were carefully placed under direct laparoscopic visualization.  I could see a phlegmon on the dome of the bladder near the uterus.  Mobilize some loops of small bowel off this area to reveal a thickened inflamed appendix tip going towards the right paracolic gutter.  I mobilized the terminal ileum to cecum in a lateral to medial fashion.  I took care to avoid injuring any retroperitoneal structures.  I freed the appendix off its attachments to the bladder neck side and pelvic rim.  I elevated the appendix. I skeletonized the mesoappendix. I was able to free off the base of the appendix which was still viable.  I stapled the appendix off the cecum using a laparoscopic stapler. I took a healthy cuff of viable cecum. I ligated the mesoappendix and assured hemostasis in the mesentery.  I placed the appendix inside an EndoCatch bag and removed out the 12 mm port.  I did copious irrigation. Hemostasis was good in the mesoappendix, colon mesentery, and retroperitoneum. Staple line was intact on the cecum with no bleeding. I washed out the pelvis, retrohepatic space  and right paracolic gutter. I washed out the left side as well.  Hemostasis is good. There was no perforation or injury.  Because the area cleaned up well after irrigation, I did not place a drain.  I aspirated the carbon dioxide. I removed  the ports. I closed the 12 mm fascia site using a 0 Vicryl stitch with a laparoscopic suture passer under direct laparoscopic visualization.. I closed skin using 4-0 monocryl stitch.  Patient was extubated and sent to the recovery room.  I discussed the operative findings with the patient's family. I suspect the patient is going used in the hospital at least overnight and will need antibiotics for 5 days.  with the phlegmon and probable early perforation, increased risk for postoperative ileus.  However not diffuse peritonitis so we will start with clear liquids and see how things go.  Questions answered. They expressed understanding and appreciation.  Ardeth SportsmanSteven C. Davonna Ertl, M.D., F.A.C.S. Gastrointestinal and Minimally Invasive Surgery [facility] [address] [place], [place] [id number] [phone] Main / Paging  08/31/2016 8:51 AM

## 2016-08-31 NOTE — Progress Notes (Signed)
IV to left hand occluded, H. Stubblefield called to restart.

## 2016-08-31 NOTE — Transfer of Care (Signed)
Immediate Anesthesia Transfer of Care Note  Patient: Cassandra Gould  Procedure(s) Performed: Procedure(s): APPENDECTOMY LAPAROSCOPIC (N/A)  Patient Location: PACU  Anesthesia Type:General  Level of Consciousness: awake, alert  and oriented  Airway & Oxygen Therapy: Patient Spontanous Breathing and Patient connected to face mask oxygen  Post-op Assessment: Report given to RN and Post -op Vital signs reviewed and stable  Post vital signs: Reviewed and stable  Last Vitals:  Vitals:   08/31/16 0230 08/31/16 0630  BP: 92/68 (!) 150/55  Pulse: 99 (!) 121  Resp: 18 18  Temp: 37.1 C 37.8 C    Last Pain:  Vitals:   08/31/16 0630  TempSrc: Oral  PainSc:          Complications: No apparent anesthesia complications

## 2016-08-31 NOTE — H&P (Signed)
[facility]  [address] [place], [place] [address] Phone: [phone] FAX: [phone]     Cassandra Gould  1956-08-06 [id number]  CARE TEAM:  PCP: [name], MD  Outpatient Care Team: Patient Care Team: Nolene Ebbs, MD as PCP - General (Internal Medicine) Lorretta Harp, MD as Consulting Physician (Cardiology)  Inpatient Treatment Team: Treatment Team: Attending Provider: Nat Christen, MD; Registered Nurse: Arman Bogus, RN; Technician: Tora Kindred, NT; Respiratory Therapist: Nelly Laurence, RRT; Registered Nurse: Bailey Mech, RN; Consulting Physician: Nolon Nations, MD   This patient is a 60 y.o.female who presents today for surgical evaluation at the request of Dr. Nat Christen, [facility]  Reason for evaluation: Appendicitis  Morbidly obese female with diabetes and hypertension.  Developed abdominal pain a few days ago.  Became more intense and focal in the lower abdomen.  Seems more right-sided.  Began to get some subjective chills.  Urination with discomfort.  Felt worse.  Came to the emergency room.  Daughter frustrated/concerned.  History physical and CT scan suspicious for appendicitis.  Surgical consultation requested.  Patient had a tubal ligation but no other abdominal surgeries.  No major cardiopulmonary issues.  She does not smoke.  Recent urinary tract infection or pyuria.  No personal nor family history of GI/colon cancer, inflammatory bowel disease, irritable bowel syndrome, allergy such as Celiac Sprue, dietary/dairy problems, colitis, ulcers nor gastritis.  No recent sick contacts/gastroenteritis.  No travel outside the country.  No changes in diet.  No dysphagia to solids or liquids.  No significant heartburn or reflux.  No hematochezia, hematemesis, coffee ground emesis.  No evidence of prior gastric/peptic ulceration.    Assessment  Cassandra Gould  60 y.o. female       Problem List:  Principal  Problem:   Acute appendicitis Active Problems:   Hypertension   Poorly controlled type 2 diabetes mellitus (HCC)   Asthma   Seasonal allergies   Obesity (BMI 30-39.9)   Acute appendicitis   Plan:  Dx lap with appendectomy:  The anatomy & physiology of the digestive tract was discussed.  The pathophysiology of appendicitis and other appendiceal disorders were discussed.  Natural history risks without surgery was discussed.   I feel the risks of no intervention will lead to serious problems that outweigh the operative risks; therefore, I recommended diagnostic laparoscopy with removal of appendix to remove the pathology.  Laparoscopic & open techniques were discussed.   I noted a good likelihood this will help address the problem.   Risks such as bleeding, infection, abscess, leak, reoperation, injury to other organs, need for repair of tissues / organs, possible ostomy, hernia, heart attack, stroke, death, and other risks were discussed.  Goals of post-operative recovery were discussed as well.  We will work to minimize complications.  Questions were answered.  The patient & son expressed understanding & wish to proceed with surgery.  -DM control - SSI for now HTN control - IV metoprolol Asthma - PRN inhaler -VTE prophylaxis- SCDs, etc -mobilize as tolerated to help recovery    Adin Hector, M.D., F.A.C.S. Gastrointestinal and Minimally Invasive Surgery [facility] [address], [place], [place] [address] [phone] Main / Paging   08/31/2016      Past Medical History:  Diagnosis Date  . Asthma    bronchites  . Bronchitis   . Diabetes mellitus   . Hypertension  Past Surgical History:  Procedure Laterality Date  . TUBAL LIGATION      Social History   Social History  . Marital status: Divorced    Spouse name: N/A  . Number of children: N/A  . Years of education: N/A   Occupational History  . Bus Driver     Social History Main Topics  . Smoking status: Former Research scientist (life sciences)  . Smokeless tobacco: Never Used  . Alcohol use No  . Drug use: No  . Sexual activity: No   Other Topics Concern  . Not on file   Social History Narrative  . No narrative on file    Family History  Problem Relation Age of Onset  . Hypertension Mother   . Diabetes Mother   . Diabetes Sister     Current Facility-Administered Medications  Medication Dose Route Frequency Provider Last Rate Last Dose  . acetaminophen (TYLENOL) tablet 650 mg  650 mg Oral Q6H PRN [name], MD       Or  . acetaminophen (TYLENOL) suppository 650 mg  650 mg Rectal Q6H PRN [name], MD      . acetaminophen (TYLENOL) tablet 1,000 mg  1,000 mg Oral On Call to OR [name], MD      . albuterol (PROVENTIL) (2.5 MG/3ML) 0.083% nebulizer solution 3 mL  3 mL Inhalation Q6H PRN [name], MD      . alum & mag hydroxide-simeth (MAALOX/MYLANTA) 200-200-20 MG/5ML suspension 30 mL  30 mL Oral Q6H PRN [name], MD      . aspirin-acetaminophen-caffeine [facility] MIGRAINE) per tablet 1 tablet  1 tablet Oral Q6H PRN [name], MD      . bisacodyl (DULCOLAX) suppository 10 mg  10 mg Rectal Q12H PRN [name], MD      . celecoxib (CELEBREX) 400 MG capsule 400 mg  400 mg Oral On Call to OR [name], MD      . Chlorhexidine Gluconate Cloth 2 % PADS 6 each  6 each Topical Once [name], MD       And  . Chlorhexidine Gluconate Cloth 2 % PADS 6 each  6 each Topical Once [name], MD      . diphenhydrAMINE (BENADRYL) 12.5 MG/5ML elixir 12.5 mg  12.5 mg Oral Q6H PRN [name], MD       Or  . diphenhydrAMINE (BENADRYL) injection 12.5 mg  12.5 mg Intravenous Q6H PRN [name], MD      . Derrill Memo ON 09/01/2016] enoxaparin (LOVENOX) injection 40 mg  40 mg Subcutaneous Q24H [name], MD      . gabapentin (NEURONTIN) capsule 300 mg  300 mg Oral On Call to OR [name], MD      . hydrALAZINE (APRESOLINE) injection 5-20 mg   5-20 mg Intravenous Q6H PRN [name], MD      . HYDROmorphone (DILAUDID) injection 0.5-2 mg  0.5-2 mg Intravenous Q2H PRN [name], MD      . insulin aspart (novoLOG) injection 0-15 Units  0-15 Units Subcutaneous Q4H [name], MD      . ketorolac (TORADOL) 15 MG/ML injection 15-30 mg  15-30 mg Intravenous Q6H PRN [name], MD      . lactated ringers bolus 1,000 mL  1,000 mL Intravenous Q8H PRN [name], MD      . lactated ringers bolus 1,000 mL  1,000 mL Intravenous Once [name], MD      . lactated ringers infusion   Intravenous Continuous [name],  MD      . lip balm (CARMEX) ointment 1 application  1 application Topical BID [name], MD      . loratadine (CLARITIN) tablet 10 mg  10 mg Oral Daily [name], MD      . magic mouthwash  15 mL Oral QID PRN [name], MD      . menthol-cetylpyridinium (CEPACOL) lozenge 3 mg  1 lozenge Oral PRN [name], MD      . methocarbamol (ROBAXIN) 1,000 mg in dextrose 5 % 50 mL IVPB  1,000 mg Intravenous Q6H PRN [name], MD      . methocarbamol (ROBAXIN) tablet 1,000 mg  1,000 mg Oral Q6H PRN [name], MD      . metoCLOPramide (REGLAN) injection 5-10 mg  5-10 mg Intravenous Q6H PRN [name], MD      . metoprolol (LOPRESSOR) injection 5 mg  5 mg Intravenous Q6H PRN [name], MD      . metoprolol tartrate (LOPRESSOR) tablet 12.5 mg  12.5 mg Oral Q12H PRN [name], MD      . ondansetron (ZOFRAN-ODT) disintegrating tablet 4 mg  4 mg Oral Q6H PRN [name], MD       Or  . ondansetron [facility]) injection 4 mg  4 mg Intravenous Q6H PRN [name], MD      . phenol (CHLORASEPTIC) mouth spray 2 spray  2 spray Mouth/Throat PRN [name], MD      . piperacillin-tazobactam (ZOSYN) IVPB 3.375 g  3.375 g Intravenous Once Nat Christen, MD 12.5 mL/hr at 08/30/16 2252 3.375 g at 08/30/16 2252  . piperacillin-tazobactam (ZOSYN) IVPB 3.375 g  3.375 g Intravenous Q8H [name], MD      . prochlorperazine  (COMPAZINE) injection 5-10 mg  5-10 mg Intravenous Q4H PRN [name], MD      . simethicone [facility]) chewable tablet 40 mg  40 mg Oral Q6H PRN [name], MD      . simvastatin (ZOCOR) tablet 40 mg  40 mg Oral QPM [name], MD      . sodium chloride 0.9 % injection           . traMADol (ULTRAM) tablet 50-100 mg  50-100 mg Oral Q6H PRN [name], MD      . vitamin C (ASCORBIC ACID) tablet 500 mg  500 mg Oral BID [name], MD       Current Outpatient Prescriptions  Medication Sig Dispense Refill  . albuterol (PROVENTIL HFA;VENTOLIN HFA) 108 (90 BASE) MCG/ACT inhaler Inhale 1-2 puffs into the lungs every 6 (six) hours as needed for wheezing or shortness of breath. 1 Inhaler 0  . aspirin-acetaminophen-caffeine (EXCEDRIN MIGRAINE) 932-355-73 MG per tablet Take 1 tablet by mouth every 6 (six) hours as needed. For migraine    . cetirizine (ZYRTEC) 10 MG tablet Take 10 mg by mouth daily.    Marland Kitchen glimepiride (AMARYL) 4 MG tablet Take 4 mg by mouth 2 (two) times daily.     Marland Kitchen lisinopril-hydrochlorothiazide (PRINZIDE,ZESTORETIC) 20-12.5 MG per tablet Take 1 tablet by mouth every morning.     . pioglitazone-metformin (ACTOPLUS MET) 15-500 MG per tablet Take 1 tablet by mouth 2 (two) times daily with a meal.    . simvastatin (ZOCOR) 40 MG tablet Take 40 mg by mouth every evening.    . Vitamin D, Ergocalciferol, (DRISDOL) 50000 UNITS CAPS Take 50,000 Units by mouth every Thursday.       Allergies  Allergen Reactions  . Oxycodone Itching,  Swelling and Rash    ROS: Constitutional:  No fevers, chills, sweats.  Weight stable Eyes:  No vision changes, No discharge HENT:  No sore throats, nasal drainage Lymph: No neck swelling, No bruising easily Pulmonary:  No cough, productive sputum CV: No orthopnea, PND  Patient walks 20 minutes for about without difficulty.  No exertional chest/neck/shoulder/arm pain. GI:  No personal nor family history of GI/colon cancer, inflammatory bowel disease,  irritable bowel syndrome, allergy such as Celiac Sprue, dietary/dairy problems, colitis, ulcers nor gastritis.  No recent sick contacts/gastroenteritis.  No travel outside the country.  No changes in diet. Renal: No UTIs, No hematuria Genital:  No drainage, bleeding, masses Musculoskeletal: No severe joint pain.  Good ROM major joints Skin:  No sores or lesions.  No rashes Heme/Lymph:  No easy bleeding.  No swollen lymph nodes Neuro: No focal weakness/numbness.  No seizures Psych: No suicidal ideation.  No hallucinations  BP 100/59 (BP Location: Right Arm)   Pulse 110   Temp 100.2 F (37.9 C) (Oral)   Resp 18   Ht '5\' 11"'  (1.803 m)   Wt 127 kg (280 lb)   SpO2 95%   BMI 39.05 kg/m   Physical Exam: General: Pt awake/alert/oriented x4 in mild major acute distress Eyes: PERRL, normal EOM. Sclera nonicteric Neuro: CN II-XII intact w/o focal sensory/motor deficits. Lymph: No head/neck/groin lymphadenopathy Psych:  No delerium/psychosis/paranoia HENT: Normocephalic, Mucus membranes moist.  No thrush Neck: Supple, No tracheal deviation Chest: No pain.  Good respiratory excursion. CV:  Pulses intact.  Regular rhythm Abdomen: Obese Soft, Nondistended.  Mild guarding and moderate tenderness in right lower quadrant near McBurney's point and suprapubic region.  The abdomen seems rather nontender.  No incarcerated hernias. Gen:  No inguinal hernias.  No inguinal lymphadenopathy.   Ext:  SCDs BLE.  No significant edema.  No cyanosis Skin: No petechiae / purpurea.  No major sores Musculoskeletal: No severe joint pain.  Good ROM major joints   Results:   Labs: Results for orders placed or performed during the hospital encounter of 08/30/16 (from the past 48 hour(s))  Urinalysis, Routine w reflex microscopic     Status: None   Collection Time: 08/30/16  4:44 PM  Result Value Ref Range   Color, Urine YELLOW YELLOW   APPearance CLEAR CLEAR   Specific Gravity, Urine 1.018 1.005 - 1.030   pH  7.0 5.0 - 8.0   Glucose, UA NEGATIVE NEGATIVE mg/dL   Hgb urine dipstick NEGATIVE NEGATIVE   Bilirubin Urine NEGATIVE NEGATIVE   Ketones, ur NEGATIVE NEGATIVE mg/dL   Protein, ur NEGATIVE NEGATIVE mg/dL   Nitrite NEGATIVE NEGATIVE   Leukocytes, UA NEGATIVE NEGATIVE  CBC with Differential     Status: Abnormal   Collection Time: 08/30/16  5:01 PM  Result Value Ref Range   WBC 14.5 (H) 4.0 - 10.5 K/uL   RBC 4.35 3.87 - 5.11 MIL/uL   Hemoglobin 10.4 (L) 12.0 - 15.0 g/dL   HCT 31.8 (L) 36.0 - 46.0 %   MCV 73.1 (L) 78.0 - 100.0 fL   MCH 23.9 (L) 26.0 - 34.0 pg   MCHC 32.7 30.0 - 36.0 g/dL   RDW 12.9 11.5 - 15.5 %   Platelets 256 150 - 400 K/uL   Neutrophils Relative % 88 %   Lymphocytes Relative 8 %   Monocytes Relative 4 %   Eosinophils Relative 0 %   Basophils Relative 0 %   Neutro Abs 12.7 (H) 1.7 - 7.7 K/uL  Lymphs Abs 1.2 0.7 - 4.0 K/uL   Monocytes Absolute 0.6 0.1 - 1.0 K/uL   Eosinophils Absolute 0.0 0.0 - 0.7 K/uL   Basophils Absolute 0.0 0.0 - 0.1 K/uL   RBC Morphology ELLIPTOCYTES   Comprehensive metabolic panel     Status: Abnormal   Collection Time: 08/30/16  5:01 PM  Result Value Ref Range   Sodium 135 135 - 145 mmol/L   Potassium 4.0 3.5 - 5.1 mmol/L   Chloride 100 (L) 101 - 111 mmol/L   CO2 24 22 - 32 mmol/L   Glucose, Bld 214 (H) 65 - 99 mg/dL   BUN 17 6 - 20 mg/dL   Creatinine, Ser 0.86 0.44 - 1.00 mg/dL   Calcium 9.6 8.9 - 10.3 mg/dL   Total Protein 8.4 (H) 6.5 - 8.1 g/dL   Albumin 4.1 3.5 - 5.0 g/dL   AST 29 15 - 41 U/L   ALT 26 14 - 54 U/L   Alkaline Phosphatase 115 38 - 126 U/L   Total Bilirubin 1.3 (H) 0.3 - 1.2 mg/dL   GFR calc non Af Amer >60 >60 mL/min   GFR calc Af Amer >60 >60 mL/min    Comment: (NOTE) The eGFR has been calculated using the CKD EPI equation. This calculation has not been validated in all clinical situations. eGFR's persistently <60 mL/min signify possible Chronic Kidney Disease.    Anion gap 11 5 - 15  Lipase, blood      Status: None   Collection Time: 08/30/16  5:01 PM  Result Value Ref Range   Lipase 30 11 - 51 U/L  I-Stat CG4 Lactic Acid, ED     Status: None   Collection Time: 08/30/16  5:14 PM  Result Value Ref Range   Lactic Acid, Venous 1.40 0.5 - 1.9 mmol/L  I-Stat CG4 Lactic Acid, ED     Status: None   Collection Time: 08/30/16  9:26 PM  Result Value Ref Range   Lactic Acid, Venous 1.24 0.5 - 1.9 mmol/L    Imaging / Studies: Dg Chest 2 View  Result Date: 08/30/2016 CLINICAL DATA:  60 y/o F; right-sided abdominal pain as well as cough and shortness of breath for 1 week. EXAM: CHEST  2 VIEW COMPARISON:  08/26/2013 chest radiograph. FINDINGS: Stable cardiac silhouette given projection and technique. Aortic atherosclerosis with arch calcification. Clear lungs. No pleural effusion. Bones are unremarkable. IMPRESSION: No active cardiopulmonary disease.  Aortic atherosclerosis. Electronically Signed   By: Kristine Garbe M.D.   On: 08/30/2016 20:51   Ct Abdomen Pelvis W Contrast  Result Date: 08/30/2016 CLINICAL DATA:  Lower right abdominal pain. EXAM: CT ABDOMEN AND PELVIS WITH CONTRAST TECHNIQUE: Multidetector CT imaging of the abdomen and pelvis was performed using the standard protocol following bolus administration of intravenous contrast. CONTRAST:  169m ISOVUE-300 IOPAMIDOL (ISOVUE-300) INJECTION 61% COMPARISON:  None. FINDINGS: Lower chest: No acute abnormality. Hepatobiliary: Probable tiny cyst in the liver on series 2, image 13. The liver, gallbladder, and portal vein are otherwise normal. Pancreas: Unremarkable. No pancreatic ductal dilatation or surrounding inflammatory changes. Spleen: Normal in size without focal abnormality. Adrenals/Urinary Tract: Adrenal glands are unremarkable. Kidneys are normal, without renal calculi, focal lesion, or hydronephrosis. Bladder is unremarkable. Stomach/Bowel: The stomach and small bowel are normal. The colon is normal. The appendix is enlarged and  inflamed consistent with appendicitis without perforation. Vascular/Lymphatic: Atherosclerotic change seen in the non aneurysmal aorta. No adenopathy. Reproductive: Large calcified fibroid in the uterus. Other: No abdominal wall  hernia or abnormality. No abdominopelvic ascites. Musculoskeletal: Pedicle rods and screws at L4 through S1 with disc spacer devices at the same levels. IMPRESSION: 1. Appendicitis without evidence of perforation or abscess. 2. Atherosclerotic change in the abdominal aorta. Electronically Signed   By: Dorise Bullion III M.D   On: 08/30/2016 22:01    Medications / Allergies: per chart  Antibiotics: Anti-infectives    Start     Dose/Rate Route Frequency Ordered Stop   08/31/16 0600  piperacillin-tazobactam (ZOSYN) IVPB 3.375 g     3.375 g 12.5 mL/hr over 240 Minutes Intravenous Every 8 hours 08/31/16 0024     08/30/16 2245  piperacillin-tazobactam (ZOSYN) IVPB 3.375 g     3.375 g 12.5 mL/hr over 240 Minutes Intravenous  Once 08/30/16 2239          Note: Portions of this report may have been transcribed using voice recognition software. Every effort was made to ensure accuracy; however, inadvertent computerized transcription errors may be present.   Any transcriptional errors that result from this process are unintentional.    Adin Hector, M.D., F.A.C.S. Gastrointestinal and Minimally Invasive Surgery [facility] [address], [place], [place] [address] [phone] Main / Paging   08/31/2016

## 2016-09-01 ENCOUNTER — Encounter (HOSPITAL_COMMUNITY): Payer: Self-pay | Admitting: Surgery

## 2016-09-01 LAB — GLUCOSE, CAPILLARY
GLUCOSE-CAPILLARY: 246 mg/dL — AB (ref 65–99)
GLUCOSE-CAPILLARY: 164 mg/dL — AB (ref 65–99)
GLUCOSE-CAPILLARY: 304 mg/dL — AB (ref 65–99)
GLUCOSE-CAPILLARY: 184 mg/dL — AB (ref 65–99)
GLUCOSE-CAPILLARY: 142 mg/dL — AB (ref 65–99)
Glucose-Capillary: 126 mg/dL — ABNORMAL HIGH (ref 65–99)
Glucose-Capillary: 245 mg/dL — ABNORMAL HIGH (ref 65–99)

## 2016-09-01 LAB — HEMOGLOBIN A1C
HEMOGLOBIN A1C: 9.6 % — AB (ref 4.8–5.6)
Mean Plasma Glucose: 229 mg/dL

## 2016-09-01 LAB — BASIC METABOLIC PANEL
ANION GAP: 9 (ref 5–15)
BUN: 19 mg/dL (ref 6–20)
CO2: 24 mmol/L (ref 22–32)
Calcium: 9.4 mg/dL (ref 8.9–10.3)
Chloride: 103 mmol/L (ref 101–111)
Creatinine, Ser: 1.31 mg/dL — ABNORMAL HIGH (ref 0.44–1.00)
GFR calc non Af Amer: 43 mL/min — ABNORMAL LOW (ref >60)
GFR, EST AFRICAN AMERICAN: 50 mL/min — AB (ref >60)
Glucose, Bld: 119 mg/dL — ABNORMAL HIGH (ref 65–99)
POTASSIUM: 4 mmol/L (ref 3.5–5.1)
Sodium: 136 mmol/L (ref 135–145)

## 2016-09-01 LAB — URINE CULTURE

## 2016-09-01 LAB — CBC
HEMATOCRIT: 26.6 % — AB (ref 36.0–46.0)
HEMOGLOBIN: 8.6 g/dL — AB (ref 12.0–15.0)
MCH: 23.5 pg — ABNORMAL LOW (ref 26.0–34.0)
MCHC: 32.3 g/dL (ref 30.0–36.0)
MCV: 72.7 fL — ABNORMAL LOW (ref 78.0–100.0)
Platelets: 264 10*3/uL (ref 150–400)
RBC: 3.66 MIL/uL — ABNORMAL LOW (ref 3.87–5.11)
RDW: 13.1 % (ref 11.5–15.5)
WBC: 12.8 10*3/uL — AB (ref 4.0–10.5)

## 2016-09-01 MED ORDER — INSULIN ASPART 100 UNIT/ML ~~LOC~~ SOLN
0.0000 [IU] | Freq: Three times a day (TID) | SUBCUTANEOUS | Status: DC
  Administered 2016-09-01: 2 [IU] via SUBCUTANEOUS
  Administered 2016-09-01: 11 [IU] via SUBCUTANEOUS
  Administered 2016-09-01 – 2016-09-02 (×2): 3 [IU] via SUBCUTANEOUS
  Administered 2016-09-02: 8 [IU] via SUBCUTANEOUS
  Administered 2016-09-02: 3 [IU] via SUBCUTANEOUS
  Administered 2016-09-02: 2 [IU] via SUBCUTANEOUS
  Administered 2016-09-03: 5 [IU] via SUBCUTANEOUS

## 2016-09-01 NOTE — Progress Notes (Signed)
Patient ID: Cassandra GillisShirley Marciel, female   DOB: 05/05/1956, 60 y.o.   MRN: [id number]  [facility]  1 Day Post-Op  Subjective: Patient reports abdominal soreness this morning; tylenol is not enough to control her pain. Passing flatus and tolerating full liquids. Ambulated twice.  Objective: Vital signs in last 24 hours: Temp:  [97.7 F (36.5 C)-98.6 F (37 C)] 97.7 F (36.5 C) (12/18 0427) Pulse Rate:  [72-94] 72 (12/18 0427) Resp:  [16-18] 18 (12/18 0427) BP: (117-144)/(50-66) 123/58 (12/18 0427) SpO2:  [95 %-100 %] 100 % (12/18 0833) Last BM Date: 08/30/16  Intake/Output from previous day: 12/17 0701 - 12/18 0700 In: 1791.7 [P.O.:120; I.V.:1621.7; IV Piggyback:50] Out: 735 [Urine:725; Blood:10] Intake/Output this shift: Total I/O In: -  Out: 300 [Urine:300]  PE: Gen:  Alert, NAD, pleasant Card:  RRR, no M/G/R heard Pulm:  CTAB, no wheezing, effort normal Abd: obese, Soft, ND, appropriately tender, +BS, lap incisions covered with clean/dry dressings  Lab Results:   Recent Labs  08/30/16 1701 09/01/16 0750  WBC 14.5* 12.8*  HGB 10.4* 8.6*  HCT 31.8* 26.6*  PLT 256 264   BMET  Recent Labs  08/30/16 1701 09/01/16 0750  NA 135 136  K 4.0 4.0  CL 100* 103  CO2 24 24  GLUCOSE 214* 119*  BUN 17 19  CREATININE 0.86 1.31*  CALCIUM 9.6 9.4   PT/INR No results for input(s): LABPROT, INR in the last 72 hours. CMP     Component Value Date/Time   NA 136 09/01/2016 0750   K 4.0 09/01/2016 0750   CL 103 09/01/2016 0750   CO2 24 09/01/2016 0750   GLUCOSE 119 (H) 09/01/2016 0750   BUN 19 09/01/2016 0750   CREATININE 1.31 (H) 09/01/2016 0750   CALCIUM 9.4 09/01/2016 0750   PROT 8.4 (H) 08/30/2016 1701   ALBUMIN 4.1 08/30/2016 1701   AST 29 08/30/2016 1701   ALT 26 08/30/2016 1701   ALKPHOS 115 08/30/2016 1701   BILITOT 1.3 (H) 08/30/2016 1701   GFRNONAA 43 (L) 09/01/2016 0750   GFRAA 50 (L) 09/01/2016 0750   Lipase     Component  Value Date/Time   LIPASE 30 08/30/2016 1701       Studies/Results: Dg Chest 2 View  Result Date: 08/30/2016 CLINICAL DATA:  60 y/o F; right-sided abdominal pain as well as cough and shortness of breath for 1 week. EXAM: CHEST  2 VIEW COMPARISON:  08/26/2013 chest radiograph. FINDINGS: Stable cardiac silhouette given projection and technique. Aortic atherosclerosis with arch calcification. Clear lungs. No pleural effusion. Bones are unremarkable. IMPRESSION: No active cardiopulmonary disease.  Aortic atherosclerosis. Electronically Signed   By: Mitzi HansenLance  Furusawa-Stratton M.D.   On: 08/30/2016 20:51   Ct Abdomen Pelvis W Contrast  Result Date: 08/30/2016 CLINICAL DATA:  Lower right abdominal pain. EXAM: CT ABDOMEN AND PELVIS WITH CONTRAST TECHNIQUE: Multidetector CT imaging of the abdomen and pelvis was performed using the standard protocol following bolus administration of intravenous contrast. CONTRAST:  100mL ISOVUE-300 IOPAMIDOL (ISOVUE-300) INJECTION 61% COMPARISON:  None. FINDINGS: Lower chest: No acute abnormality. Hepatobiliary: Probable tiny cyst in the liver on series 2, image 13. The liver, gallbladder, and portal vein are otherwise normal. Pancreas: Unremarkable. No pancreatic ductal dilatation or surrounding inflammatory changes. Spleen: Normal in size without focal abnormality. Adrenals/Urinary Tract: Adrenal glands are unremarkable. Kidneys are normal, without renal calculi, focal lesion, or hydronephrosis. Bladder is unremarkable. Stomach/Bowel: The stomach and small bowel are normal. The colon is normal. The  appendix is enlarged and inflamed consistent with appendicitis without perforation. Vascular/Lymphatic: Atherosclerotic change seen in the non aneurysmal aorta. No adenopathy. Reproductive: Large calcified fibroid in the uterus. Other: No abdominal wall hernia or abnormality. No abdominopelvic ascites. Musculoskeletal: Pedicle rods and screws at L4 through S1 with disc spacer devices  at the same levels. IMPRESSION: 1. Appendicitis without evidence of perforation or abscess. 2. Atherosclerotic change in the abdominal aorta. Electronically Signed   By: Gerome Samavid  Williams III M.D   On: 08/30/2016 22:01    Anti-infectives: Anti-infectives    Start     Dose/Rate Route Frequency Ordered Stop   08/31/16 1200  piperacillin-tazobactam (ZOSYN) IVPB 3.375 g     3.375 g 12.5 mL/hr over 240 Minutes Intravenous Every 8 hours 08/31/16 1043 09/05/16 1359   08/31/16 0600  piperacillin-tazobactam (ZOSYN) IVPB 3.375 g  Status:  Discontinued     3.375 g 12.5 mL/hr over 240 Minutes Intravenous Every 8 hours 08/31/16 0024 08/31/16 1043   08/31/16 0000  amoxicillin-clavulanate (AUGMENTIN) 875-125 MG tablet     1 tablet Oral 2 times daily 08/31/16 0848     08/30/16 2245  piperacillin-tazobactam (ZOSYN) IVPB 3.375 g     3.375 g 12.5 mL/hr over 240 Minutes Intravenous  Once 08/30/16 2239 08/31/16 0252       Assessment/Plan Perforated appendicitis S/p laparoscopic appendectomy 12/17 Dr. Michaell CowingGross - POD 1 - WBC trending down 12.8 from 14.5, no fevers over night  Anemia - Hg 8.6 likely 2/2 dilution, no signs of bleeding, monitor AKI - Cr up to 1.31 today from 0.86. D/c toradol and increase IVF DM - SSI HTN - lisinopril and HCTZ, IV metoprolol/hydralazine PRN Asthma - inhaler PRN and duoneb  ID - zosyn 12/17>> VTE - SCDs, Lovenox FEN - full liquids and advance as tolerated  Plan - Continue to advance diet as tolerated and encourage mobilization. Due to AKI d/c toradol and increase IVF, robaxin PRN for better pain control. Upon discharge patient will need antibiotics for 5 days.   LOS: 1 day    Edson SnowballBROOKE A MILLER , [facility] 09/01/2016, 11:06 AM Pager: [phone] Consults: [phone] Mon-Fri 7:00 am-4:30 pm Sat-Sun 7:00 am-11:30 am

## 2016-09-02 LAB — BASIC METABOLIC PANEL
ANION GAP: 7 (ref 5–15)
BUN: 14 mg/dL (ref 6–20)
CALCIUM: 9.1 mg/dL (ref 8.9–10.3)
CO2: 28 mmol/L (ref 22–32)
Chloride: 104 mmol/L (ref 101–111)
Creatinine, Ser: 1.1 mg/dL — ABNORMAL HIGH (ref 0.44–1.00)
GFR calc non Af Amer: 53 mL/min — ABNORMAL LOW (ref >60)
GLUCOSE: 168 mg/dL — AB (ref 65–99)
POTASSIUM: 3.8 mmol/L (ref 3.5–5.1)
Sodium: 139 mmol/L (ref 135–145)

## 2016-09-02 LAB — GLUCOSE, CAPILLARY
GLUCOSE-CAPILLARY: 265 mg/dL — AB (ref 65–99)
GLUCOSE-CAPILLARY: 156 mg/dL — AB (ref 65–99)
GLUCOSE-CAPILLARY: 127 mg/dL — AB (ref 65–99)
Glucose-Capillary: 171 mg/dL — ABNORMAL HIGH (ref 65–99)

## 2016-09-02 MED ORDER — POLYETHYLENE GLYCOL 3350 17 G PO PACK
17.0000 g | PACK | Freq: Every day | ORAL | Status: DC
  Administered 2016-09-02: 17 g via ORAL
  Filled 2016-09-02 (×2): qty 1

## 2016-09-02 NOTE — Progress Notes (Signed)
Patient ID: Cassandra Gould, female   DOB: 05/18/1956, 60 y.o.   MRN: [id number]  [facility]  2 Days Post-Op  Subjective: Patient states that she is still very sore. Tolerating small amounts of regular diet. Denies n/v. She is passing flatus but has not had a BM. States that she would feel better if she had a BM prior to discharge.  Objective: Vital signs in last 24 hours: Temp:  [98.6 F (37 C)-98.8 F (37.1 C)] 98.8 F (37.1 C) (12/19 0543) Pulse Rate:  [90-99] 90 (12/19 0837) Resp:  [17-18] 17 (12/19 0837) BP: (135-138)/(63-73) 135/73 (12/19 0543) SpO2:  [96 %-100 %] 96 % (12/19 0837) Last BM Date: 08/29/16  Intake/Output from previous day: 12/18 0701 - 12/19 0700 In: 2680 [P.O.:480; I.V.:2000; IV Piggyback:200] Out: 500 [Urine:500] Intake/Output this shift: Total I/O In: 240 [P.O.:240] Out: 2 [Urine:2]  PE: Gen:  Alert, NAD, pleasant Card:  RRR, no M/G/R heard Pulm:  CTAB, no wheezing, effort normal Abd: obese, Soft, ND, more so tender LLQ than RLQ, also tender around incisions, +BS, lap incisions covered with clean/dry dressings  Lab Results:   Recent Labs  08/30/16 1701 09/01/16 0750  WBC 14.5* 12.8*  HGB 10.4* 8.6*  HCT 31.8* 26.6*  PLT 256 264   BMET  Recent Labs  09/01/16 0750 09/02/16 0519  NA 136 139  K 4.0 3.8  CL 103 104  CO2 24 28  GLUCOSE 119* 168*  BUN 19 14  CREATININE 1.31* 1.10*  CALCIUM 9.4 9.1   PT/INR No results for input(s): LABPROT, INR in the last 72 hours. CMP     Component Value Date/Time   NA 139 09/02/2016 0519   K 3.8 09/02/2016 0519   CL 104 09/02/2016 0519   CO2 28 09/02/2016 0519   GLUCOSE 168 (H) 09/02/2016 0519   BUN 14 09/02/2016 0519   CREATININE 1.10 (H) 09/02/2016 0519   CALCIUM 9.1 09/02/2016 0519   PROT 8.4 (H) 08/30/2016 1701   ALBUMIN 4.1 08/30/2016 1701   AST 29 08/30/2016 1701   ALT 26 08/30/2016 1701   ALKPHOS 115 08/30/2016 1701   BILITOT 1.3 (H) 08/30/2016 1701   GFRNONAA 53 (L) 09/02/2016 0519   GFRAA >60 09/02/2016 0519   Lipase     Component Value Date/Time   LIPASE 30 08/30/2016 1701       Studies/Results: No results found.  Anti-infectives: Anti-infectives    Start     Dose/Rate Route Frequency Ordered Stop   08/31/16 1200  piperacillin-tazobactam (ZOSYN) IVPB 3.375 g     3.375 g 12.5 mL/hr over 240 Minutes Intravenous Every 8 hours 08/31/16 1043 09/05/16 1359   08/31/16 0600  piperacillin-tazobactam (ZOSYN) IVPB 3.375 g  Status:  Discontinued     3.375 g 12.5 mL/hr over 240 Minutes Intravenous Every 8 hours 08/31/16 0024 08/31/16 1043   08/31/16 0000  amoxicillin-clavulanate (AUGMENTIN) 875-125 MG tablet     1 tablet Oral 2 times daily 08/31/16 0848     08/30/16 2245  piperacillin-tazobactam (ZOSYN) IVPB 3.375 g     3.375 g 12.5 mL/hr over 240 Minutes Intravenous  Once 08/30/16 2239 08/31/16 0252       Assessment/Plan Perforated appendicitis S/p laparoscopic appendectomy 12/17 Dr. Michaell CowingGross - POD 2 - afebrile, passing flatus but has not had a BM  Anemia - Hg 8.6 likely 2/2 dilution, no signs of bleeding, monitor AKI - Cr trending down, 1.1 today from 1.31 DM - SSI HTN - lisinopril and HCTZ,  IV metoprolol/hydralazine PRN Asthma - inhaler PRN and duoneb  ID - zosyn 12/17>> VTE - SCDs, Lovenox FEN - carb modified diet  Plan - Patient would prefer to have BM prior to discharge. Add miralax to daily bowel regimen. Encourage more ambulation today. Continue IVF and check BMP in AM. Upon discharge patient will need antibiotics for 5 days.   LOS: 2 days    Edson SnowballBROOKE A MILLER , [facility] 09/02/2016, 12:45 PM Pager: [phone] Consults: [phone] Mon-Fri 7:00 am-4:30 pm Sat-Sun 7:00 am-11:30 am

## 2016-09-03 LAB — BASIC METABOLIC PANEL
ANION GAP: 8 (ref 5–15)
BUN: 9 mg/dL (ref 6–20)
CHLORIDE: 102 mmol/L (ref 101–111)
CO2: 29 mmol/L (ref 22–32)
Calcium: 9.3 mg/dL (ref 8.9–10.3)
Creatinine, Ser: 0.9 mg/dL (ref 0.44–1.00)
GFR calc non Af Amer: >60 mL/min (ref >60)
GLUCOSE: 145 mg/dL — AB (ref 65–99)
POTASSIUM: 4.3 mmol/L (ref 3.5–5.1)
Sodium: 139 mmol/L (ref 135–145)

## 2016-09-03 LAB — GLUCOSE, CAPILLARY
GLUCOSE-CAPILLARY: 244 mg/dL — AB (ref 65–99)
Glucose-Capillary: 120 mg/dL — ABNORMAL HIGH (ref 65–99)

## 2016-09-03 MED ORDER — HYDROCODONE-ACETAMINOPHEN 5-325 MG PO TABS: 1.0000 | ORAL_TABLET | Freq: Four times a day (QID) | ORAL | 0 refills | Status: DC | PRN

## 2016-09-03 MED ORDER — DOCUSATE SODIUM 100 MG PO CAPS
100.0000 mg | ORAL_CAPSULE | Freq: Two times a day (BID) | ORAL | Status: DC
  Administered 2016-09-03: 100 mg via ORAL
  Filled 2016-09-03: qty 1

## 2016-09-03 MED ORDER — AMOXICILLIN-POT CLAVULANATE 875-125 MG PO TABS: 1.0000 | ORAL_TABLET | Freq: Two times a day (BID) | ORAL | 0 refills | Status: AC

## 2016-09-03 NOTE — Care Management Note (Addendum)
Case Management Note  Patient Details  Name: Linde GillisShirley Zeis MRN: [id number] Date of Birth: 01/15/1956  Subjective/Objective:  60 y.o. F admitted with Perforated Appendix s/p/ Lap Appe 08/31/2016. No home needs at present. Lives in Private residence with Daughter and grand children. Aware that she needs to follow up with PCP after discharge.                   Action/Plan: Anticipate discharge home today.Reports no CM needs at this time. Will be available should additional discharge needs arise.   Expected Discharge Date:  09/02/16               Expected Discharge Plan:  Home/Self Care  In-House Referral:  NA  Discharge planning Services  CM Consult  Post Acute Care Choice:  NA Choice offered to:  Patient  DME Arranged:  N/A DME Agency:  NA  HH Arranged:  NA HH Agency:  NA  Status of Service:  Completed, signed off  If discussed at Long Length of Stay Meetings, dates discussed:    Additional Comments:  Yvone NeuCrutchfield, Amiayah Giebel M, RN 09/03/2016, 11:06 AM

## 2016-09-03 NOTE — Discharge Summary (Signed)
[facility] Discharge Summary   Patient ID: Cassandra Gould MRN: [id number] DOB/AGE: Dec 15, 1955 60 y.o.  Admit date: 08/30/2016 Discharge date: 09/03/2016  Admitting Diagnosis: Acute appendicitis  Discharge Diagnosis Patient Active Problem List   Diagnosis Date Noted  . Acute perforated appendicitis s/p lap appendectomy 08/31/2016 08/31/2016  . Seasonal allergies 08/31/2016  . Obesity (BMI 30-39.9) 08/31/2016  . Migraines 08/31/2016  . Hypertension   . Poorly controlled type 2 diabetes mellitus ([place])   . Asthma     Consultants None   Imaging: 08/30/16 CT ABD PELV W CONTRAST - appendicitis without evidence of perforation/abscess  08/30/16 DG CHEST - Clear lungs. No pleural effusion  Procedures Dr. [name] (08/31/16) - Laparoscopic Appendectomy  Hospital Course:  60 y/o obese female with PMH HTN and DM2 who presented to [facility] with abdominal pain that became worse and localized to her RLQ over the course of a "few days".  Workup showed leukocytosis of 14,500 and CT scan concerning for acute appendicitis.  Patient was admitted and underwent procedure listed above, during which time her appendix was found to be perforated. Abdomen was irrigated and no drain was placed. Patient tolerated procedure well and was transferred to the floor.  Diet was advanced as tolerated.  On POD#3, the patient was voiding well, tolerating diet, ambulating well, pain well controlled, vital signs stable, incisions c/d/i and felt stable for discharge home. Patient will complete 5 days of PO augmentin at discharge. Patient will follow up in our office in 2-3 weeks and knows to call with questions or concerns.   Physical Exam: General:  Alert, NAD, pleasant, comfortable Abd:  Soft, obese, NT/ND, incisions C/D/I   Allergies as of 09/03/2016      Reactions   Oxycodone Itching, Swelling, Rash      Medication List    TAKE these medications   albuterol 108 (90 Base) MCG/ACT  inhaler Commonly known as:  PROVENTIL HFA;VENTOLIN HFA Inhale 1-2 puffs into the lungs every 6 (six) hours as needed for wheezing or shortness of breath.   amoxicillin-clavulanate 875-125 MG tablet Commonly known as:  AUGMENTIN Take 1 tablet by mouth 2 (two) times daily.   aspirin-acetaminophen-caffeine 250-250-65 MG tablet Commonly known as:  EXCEDRIN MIGRAINE Take 1 tablet by mouth every 6 (six) hours as needed. For migraine   cetirizine 10 MG tablet Commonly known as:  ZYRTEC Take 10 mg by mouth daily.   glimepiride 4 MG tablet Commonly known as:  AMARYL Take 4 mg by mouth 2 (two) times daily.   HYDROcodone-acetaminophen 5-325 MG tablet Commonly known as:  NORCO Take 1-2 tablets by mouth every 6 (six) hours as needed for moderate pain or severe pain.   lisinopril-hydrochlorothiazide 20-12.5 MG tablet Commonly known as:  PRINZIDE,ZESTORETIC Take 1 tablet by mouth every morning.   naproxen 500 MG tablet Commonly known as:  NAPROSYN Take 1 tablet (500 mg total) by mouth every 12 (twelve) hours as needed for mild pain or moderate pain.   pioglitazone-metformin 15-500 MG tablet Commonly known as:  ACTOPLUS MET Take 1 tablet by mouth 2 (two) times daily with a meal.   simvastatin 40 MG tablet Commonly known as:  ZOCOR Take 40 mg by mouth every evening.   traMADol 50 MG tablet Commonly known as:  ULTRAM Take 1-2 tablets (50-100 mg total) by mouth every 6 (six) hours as needed for moderate pain or severe pain.   Vitamin D (Ergocalciferol) 50000 units Caps capsule Commonly known as:  DRISDOL Take 50,000 Units by  mouth every Thursday.        Follow-up [facility], [place]. Schedule an appointment as soon as possible for a visit in 3 week(s).   Specialty:  General Surgery Why:  To follow up after your operation, To follow up after your hospital stay Contact information: [address] [place]  [place] [phone]          Signed: Obie Dredge, [facility] 09/03/2016, 10:39 AM Pager: [phone] Consults: [phone] Mon-Fri 7:00 am-4:30 pm Sat-Sun 7:00 am-11:30 am

## 2016-11-07 ENCOUNTER — Encounter (HOSPITAL_COMMUNITY): Payer: Self-pay

## 2016-11-07 ENCOUNTER — Emergency Department (HOSPITAL_COMMUNITY)
Admission: EM | Admit: 2016-11-07 | Discharge: 2016-11-07 | Disposition: A | Discharge status: Home / Self Care | Payer: BC Managed Care – PPO | Attending: Emergency Medicine | Admitting: Emergency Medicine

## 2016-11-07 DIAGNOSIS — J45909 Unspecified asthma, uncomplicated: Secondary | ICD-10-CM | POA: Insufficient documentation

## 2016-11-07 DIAGNOSIS — X58XXXA Exposure to other specified factors, initial encounter: Secondary | ICD-10-CM | POA: Diagnosis not present

## 2016-11-07 DIAGNOSIS — I10 Essential (primary) hypertension: Secondary | ICD-10-CM | POA: Insufficient documentation

## 2016-11-07 DIAGNOSIS — E119 Type 2 diabetes mellitus without complications: Secondary | ICD-10-CM | POA: Insufficient documentation

## 2016-11-07 DIAGNOSIS — S0100XA Unspecified open wound of scalp, initial encounter: Secondary | ICD-10-CM | POA: Diagnosis not present

## 2016-11-07 DIAGNOSIS — Z7982 Long term (current) use of aspirin: Secondary | ICD-10-CM | POA: Diagnosis not present

## 2016-11-07 DIAGNOSIS — Z7984 Long term (current) use of oral hypoglycemic drugs: Secondary | ICD-10-CM | POA: Diagnosis not present

## 2016-11-07 DIAGNOSIS — Y929 Unspecified place or not applicable: Secondary | ICD-10-CM | POA: Diagnosis not present

## 2016-11-07 DIAGNOSIS — Y999 Unspecified external cause status: Secondary | ICD-10-CM | POA: Insufficient documentation

## 2016-11-07 DIAGNOSIS — Z87891 Personal history of nicotine dependence: Secondary | ICD-10-CM | POA: Diagnosis not present

## 2016-11-07 DIAGNOSIS — Y939 Activity, unspecified: Secondary | ICD-10-CM | POA: Insufficient documentation

## 2016-11-07 MED ORDER — NAPROXEN 500 MG PO TABS
500.0000 mg | ORAL_TABLET | Freq: Once | ORAL | Status: AC
  Administered 2016-11-07: 500 mg via ORAL
  Filled 2016-11-07: qty 1

## 2016-11-07 MED ORDER — DOXYCYCLINE HYCLATE 100 MG PO CAPS: 100.0000 mg | ORAL_CAPSULE | Freq: Two times a day (BID) | ORAL | 0 refills | Status: DC

## 2016-11-07 MED ORDER — TRAMADOL HCL 50 MG PO TABS: 50.0000 mg | ORAL_TABLET | Freq: Four times a day (QID) | ORAL | 0 refills | Status: DC | PRN

## 2016-11-07 MED ORDER — DOXYCYCLINE HYCLATE 100 MG PO TABS
100.0000 mg | ORAL_TABLET | Freq: Once | ORAL | Status: AC
Start: 2016-11-07 — End: 2016-11-07
  Administered 2016-11-07: 100 mg via ORAL
  Filled 2016-11-07: qty 1

## 2016-11-07 MED ORDER — NAPROXEN 500 MG PO TABS: 500.0000 mg | ORAL_TABLET | Freq: Two times a day (BID) | ORAL | 0 refills | Status: DC

## 2016-11-07 MED ORDER — NAPROXEN 500 MG PO TABS: 500.0000 mg | ORAL_TABLET | Freq: Once | ORAL | Status: DC

## 2016-11-07 NOTE — ED Provider Notes (Signed)
[name]   CSN: [id number] Arrival date & time: 11/07/16  0020 By signing my name below, I, [name], attest that this documentation has been prepared under the direction and in the presence of non-physician practitioner, Quincy Carnes, PA-C. Electronically Signed: [name], Scribe. 11/07/2016. 1:55 AM.   History   Chief Complaint Chief Complaint  Patient presents with  . Wound Dehiscence    HPI Cassandra Gould is a 61 y.o. female with a history of asthma, DM, and HTN who presents to the Emergency Department complaining of a moderately painful, progressively worsening sore to her left scalp x 2 weeks. Pt had her hair relaxed two weeks ago and has several irritated sores to her scalp, but this one is the most severe. Per pt, the sore to her left scalp began to drain yesterday.  Pain is exacerbated by direct palpation, pressure, and when washing her hair. No alleviating factors noted. She denies any fever. Pt has no other complaints or symptoms at this time.   PCP: [name], MD  The history is provided by the patient. No language interpreter was used.   Past Medical History:  Diagnosis Date  . Asthma    bronchites  . Bronchitis   . Diabetes mellitus   . Hypertension     Patient Active Problem List   Diagnosis Date Noted  . Acute perforated appendicitis s/p lap appendectomy 08/31/2016 08/31/2016  . Seasonal allergies 08/31/2016  . Obesity (BMI 30-39.9) 08/31/2016  . Migraines 08/31/2016  . Hypertension   . Poorly controlled type 2 diabetes mellitus ([place])   . Asthma     Past Surgical History:  Procedure Laterality Date  . LAPAROSCOPIC APPENDECTOMY N/A 08/31/2016   Procedure: APPENDECTOMY LAPAROSCOPIC;  Surgeon: [name], MD;  Location: [facility] ORS;  Service: General;  Laterality: N/A;  . TUBAL LIGATION      OB History    No data available     Home Medications    Prior to Admission medications   Medication Sig Start Date End  Date Taking? Authorizing Provider  albuterol (PROVENTIL HFA;VENTOLIN HFA) 108 (90 BASE) MCG/ACT inhaler Inhale 1-2 puffs into the lungs every 6 (six) hours as needed for wheezing or shortness of breath. 08/26/13  Yes Harvie Heck, PA-C  aspirin-acetaminophen-caffeine (EXCEDRIN MIGRAINE) [phone] MG per tablet Take 1 tablet by mouth every 6 (six) hours as needed. For migraine   Yes Historical Provider, MD  cetirizine (ZYRTEC) 10 MG tablet Take 10 mg by mouth daily.   Yes Historical Provider, MD  glimepiride (AMARYL) 4 MG tablet Take 4 mg by mouth 2 (two) times daily.    Yes Historical Provider, MD  lisinopril-hydrochlorothiazide (PRINZIDE,ZESTORETIC) 20-12.5 MG per tablet Take 1 tablet by mouth every morning.    Yes Historical Provider, MD  pioglitazone-metformin (ACTOPLUS MET) 15-500 MG per tablet Take 1 tablet by mouth 2 (two) times daily with a meal.   Yes Historical Provider, MD  simvastatin (ZOCOR) 40 MG tablet Take 40 mg by mouth every evening.   Yes Historical Provider, MD  Vitamin D, Ergocalciferol, (DRISDOL) 50000 UNITS CAPS Take 50,000 Units by mouth every Thursday.   Yes Historical Provider, MD  HYDROcodone-acetaminophen (NORCO) 5-325 MG tablet Take 1-2 tablets by mouth every 6 (six) hours as needed for moderate pain or severe pain. Patient not taking: Reported on 11/07/2016 09/03/16   [name], PA-C  naproxen (NAPROSYN) 500 MG tablet Take 1 tablet (500 mg total) by mouth every 12 (twelve) hours as needed for  mild pain or moderate pain. Patient not taking: Reported on 11/07/2016 08/31/16   [name], MD  traMADol (ULTRAM) 50 MG tablet Take 1-2 tablets (50-100 mg total) by mouth every 6 (six) hours as needed for moderate pain or severe pain. Patient not taking: Reported on 11/07/2016 08/31/16   [name], MD   Family History Family History  Problem Relation Age of Onset  . Hypertension Mother   . Diabetes Mother   . Diabetes Sister     Social History Social History    Substance Use Topics  . Smoking status: Former Research scientist (life sciences)  . Smokeless tobacco: Never Used  . Alcohol use No    Allergies   Oxycodone   Review of Systems Review of Systems  Constitutional: Negative for fever.  Skin: Positive for wound.  All other systems reviewed and are negative.  Physical Exam Updated Vital Signs BP 177/66 (BP Location: Right Arm)   Pulse 77   Temp 98.3 F (36.8 C) (Oral)   Resp 20   Ht 5' 11" (1.803 m)   Wt 273 lb (123.8 kg)   SpO2 99%   BMI 38.08 kg/m   Physical Exam  Constitutional: She is oriented to person, place, and time. She appears well-developed and well-nourished.  HENT:  Head: Normocephalic and atraumatic.  Mouth/Throat: Oropharynx is clear and moist.  Scab of left scalp just above the ear, skin locally appears irritated with small amount of serosanguinous drainage, there is no evidence of facial or scalp cellulitis  Eyes: Conjunctivae and EOM are normal. Pupils are equal, round, and reactive to light.  Neck: Normal range of motion.  Cardiovascular: Normal rate, regular rhythm and normal heart sounds.   Pulmonary/Chest: Effort normal and breath sounds normal.  Abdominal: Soft. Bowel sounds are normal.  Musculoskeletal: Normal range of motion.  Neurological: She is alert and oriented to person, place, and time.  Skin: Skin is warm and dry.  Psychiatric: She has a normal mood and affect.  Nursing note and vitals reviewed.  ED Treatments / Results  DIAGNOSTIC STUDIES:  Oxygen Saturation is 99% on RA, normal by my interpretation.    COORDINATION OF CARE:  1:50 AM Discussed treatment plan with pt at bedside and pt agreed to plan.  Labs (all labs ordered are listed, but only abnormal results are displayed) Labs Reviewed - No data to display  EKG  EKG Interpretation None       Radiology No results found.  Procedures Procedures (including critical care time)  Medications Ordered in ED Medications - No data to  display   Initial Impression / Assessment and Plan / ED Course  I have reviewed the triage vital signs and the nursing notes.  Pertinent labs & imaging results that were available during my care of the patient were reviewed by me and considered in my medical decision making (see chart for details).  61 year old female here with wound of the left side of her scalp. Got a hair relaxer about 2 weeks ago and has noticed several irritated areas on her scalp. She has a small wound of the left scalp just above the ear. This area is tender to palpation, skin appears irritated. There is small amount of serosanguineous drainage with overlying scab. There is no evidence of scalp or facial cellulitis at this time. Will start on course of doxycycline. Encourage warm compresses at home. Follow-up with her primary care doctor if not improving in the next few days.  Discussed plan with patient, she acknowledged understanding and  agreed with plan of care.  Return precautions given for new or worsening symptoms.  Final Clinical Impressions(s) / ED Diagnoses   Final diagnoses:  Open wound of scalp, unspecified open wound type, initial encounter    New Prescriptions Discharge Medication List as of 11/07/2016  2:56 AM    START taking these medications   Details  doxycycline (VIBRAMYCIN) 100 MG capsule Take 1 capsule (100 mg total) by mouth 2 (two) times daily., Starting Fri 11/07/2016, Print       I personally performed the services described in this documentation, which was scribed in my presence. The recorded information has been reviewed and is accurate.   Larene Pickett, PA-C 11/07/16 0867    April Palumbo, MD 11/07/16 [phone]

## 2016-11-07 NOTE — ED Notes (Signed)
Open area left side of scalp pt occurred after use of hair texturizer pt reports pain 10/10 scale.

## 2016-11-07 NOTE — ED Triage Notes (Signed)
Sore open on left side of scalp states opened up tonight with minimal drainage no fever voiced states has been there for about a week.

## 2016-11-07 NOTE — Discharge Instructions (Signed)
Take the prescribed medication as directed.  Recommend warm compresses to affected area a few times a day. Follow-up with your primary care doctor if not improving in the next 48 hours. Return to the ED for new or worsening symptoms.

## 2016-12-26 ENCOUNTER — Encounter (INDEPENDENT_AMBULATORY_CARE_PROVIDER_SITE_OTHER): Payer: Self-pay | Admitting: Physician Assistant

## 2016-12-26 ENCOUNTER — Ambulatory Visit (INDEPENDENT_AMBULATORY_CARE_PROVIDER_SITE_OTHER): Payer: BC Managed Care – PPO | Admitting: Physician Assistant

## 2016-12-26 VITALS — BP 143/73 | HR 90 | Temp 97.9°F | Ht 67.5 in | Wt 267.8 lb

## 2016-12-26 DIAGNOSIS — I10 Essential (primary) hypertension: Secondary | ICD-10-CM | POA: Diagnosis not present

## 2016-12-26 DIAGNOSIS — M542 Cervicalgia: Secondary | ICD-10-CM | POA: Diagnosis not present

## 2016-12-26 DIAGNOSIS — E7800 Pure hypercholesterolemia, unspecified: Secondary | ICD-10-CM

## 2016-12-26 DIAGNOSIS — R739 Hyperglycemia, unspecified: Secondary | ICD-10-CM | POA: Diagnosis not present

## 2016-12-26 DIAGNOSIS — E119 Type 2 diabetes mellitus without complications: Secondary | ICD-10-CM | POA: Diagnosis not present

## 2016-12-26 DIAGNOSIS — R002 Palpitations: Secondary | ICD-10-CM

## 2016-12-26 LAB — POCT GLYCOSYLATED HEMOGLOBIN (HGB A1C): HEMOGLOBIN A1C: 12.1

## 2016-12-26 LAB — GLUCOSE, POCT (MANUAL RESULT ENTRY): POC GLUCOSE: 290 mg/dL — AB (ref 70–99)

## 2016-12-26 MED ORDER — METFORMIN HCL 1000 MG PO TABS: 1000.0000 mg | ORAL_TABLET | Freq: Two times a day (BID) | ORAL | 3 refills | Status: DC

## 2016-12-26 MED ORDER — INSULIN GLARGINE 100 UNITS/ML SOLOSTAR PEN: 25.0000 [IU] | PEN_INJECTOR | Freq: Every day | SUBCUTANEOUS | 5 refills | Status: DC

## 2016-12-26 MED ORDER — BLOOD GLUCOSE MONITOR KIT: PACK | 0 refills | Status: AC

## 2016-12-26 MED ORDER — GABAPENTIN 300 MG PO CAPS: 300.0000 mg | ORAL_CAPSULE | Freq: Three times a day (TID) | ORAL | 3 refills | Status: DC

## 2016-12-26 MED ORDER — GLIMEPIRIDE 4 MG PO TABS: 4.0000 mg | ORAL_TABLET | Freq: Every day | ORAL | 1 refills | Status: DC

## 2016-12-26 MED ORDER — LISINOPRIL-HYDROCHLOROTHIAZIDE 20-12.5 MG PO TABS: 2.0000 | ORAL_TABLET | Freq: Every day | ORAL | 1 refills | Status: DC
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
## 2016-12-26 NOTE — Patient Instructions (Signed)
Diabetes Mellitus and Food It is important for you to manage your blood sugar (glucose) level. Your blood glucose level can be greatly affected by what you eat. Eating healthier foods in the appropriate amounts throughout the day at about the same time each day will help you control your blood glucose level. It can also help slow or prevent worsening of your diabetes mellitus. Healthy eating may even help you improve the level of your blood pressure and reach or maintain a healthy weight. General recommendations for healthful eating and cooking habits include:  Eating meals and snacks regularly. Avoid going long periods of time without eating to lose weight.  Eating a diet that consists mainly of plant-based foods, such as fruits, vegetables, nuts, legumes, and whole grains.  Using low-heat cooking methods, such as baking, instead of high-heat cooking methods, such as deep frying. Work with your dietitian to make sure you understand how to use the Nutrition Facts information on food labels. How can food affect me? Carbohydrates  Carbohydrates affect your blood glucose level more than any other type of food. Your dietitian will help you determine how many carbohydrates to eat at each meal and teach you how to count carbohydrates. Counting carbohydrates is important to keep your blood glucose at a healthy level, especially if you are using insulin or taking certain medicines for diabetes mellitus. Alcohol  Alcohol can cause sudden decreases in blood glucose (hypoglycemia), especially if you use insulin or take certain medicines for diabetes mellitus. Hypoglycemia can be a life-threatening condition. Symptoms of hypoglycemia (sleepiness, dizziness, and disorientation) are similar to symptoms of having too much alcohol. If your health care provider has given you approval to drink alcohol, do so in moderation and use the following guidelines:  Women should not have more than one drink per day, and men  should not have more than two drinks per day. One drink is equal to:  12 oz of beer.  5 oz of wine.  1 oz of hard liquor.  Do not drink on an empty stomach.  Keep yourself hydrated. Have water, diet soda, or unsweetened iced tea.  Regular soda, juice, and other mixers might contain a lot of carbohydrates and should be counted. What foods are not recommended? As you make food choices, it is important to remember that all foods are not the same. Some foods have fewer nutrients per serving than other foods, even though they might have the same number of calories or carbohydrates. It is difficult to get your body what it needs when you eat foods with fewer nutrients. Examples of foods that you should avoid that are high in calories and carbohydrates but low in nutrients include:  Trans fats (most processed foods list trans fats on the Nutrition Facts label).  Regular soda.  Juice.  Candy.  Sweets, such as cake, pie, doughnuts, and cookies.  Fried foods. What foods can I eat? Eat nutrient-rich foods, which will nourish your body and keep you healthy. The food you should eat also will depend on several factors, including:  The calories you need.  The medicines you take.  Your weight.  Your blood glucose level.  Your blood pressure level.  Your cholesterol level. You should eat a variety of foods, including:  Protein.  Lean cuts of meat.  Proteins low in saturated fats, such as fish, egg whites, and beans. Avoid processed meats.  Fruits and vegetables.  Fruits and vegetables that may help control blood glucose levels, such as apples, mangoes, and   yams.  Dairy products.  Choose fat-free or low-fat dairy products, such as milk, yogurt, and cheese.  Grains, bread, pasta, and rice.  Choose whole grain products, such as multigrain bread, whole oats, and brown rice. These foods may help control blood pressure.  Fats.  Foods containing healthful fats, such as nuts,  avocado, olive oil, canola oil, and fish. Does everyone with diabetes mellitus have the same meal plan? Because every person with diabetes mellitus is different, there is not one meal plan that works for everyone. It is very important that you meet with a dietitian who will help you create a meal plan that is just right for you. This information is not intended to replace advice given to you by your health care provider. Make sure you discuss any questions you have with your health care provider. Document Released: 05/29/2005 Document Revised: 02/07/2016 Document Reviewed: 07/29/2013 Elsevier Interactive Patient Education  2017 [employer]. Insulin Glargine injection What is this medicine? INSULIN GLARGINE (IN su lin GLAR geen) is a human-made form of insulin. This drug lowers the amount of sugar in your blood. It is a long-acting insulin that is usually given once a day. This medicine may be used for other purposes; ask your health care provider or pharmacist if you have questions. COMMON BRAND NAME(S): BASAGLAR, Lantus, Lantus SoloStar, Toujeo SoloStar What should I tell my health care provider before I take this medicine? They need to know if you have any of these conditions: -episodes of low blood sugar -kidney disease -liver disease -an unusual or allergic reaction to insulin, metacresol, other medicines, foods, dyes, or preservatives -pregnant or trying to get pregnant -breast-feeding How should I use this medicine? This medicine is for injection under the skin. Use this medicine at the same time each day. Use exactly as directed. This insulin should never be mixed in the same syringe with other insulins before injection. Do not vigorously shake before use. You will be taught how to use this medicine and how to adjust doses for activities and illness. Do not use more insulin than prescribed. Always check the appearance of your insulin before using it. This medicine should be clear and  colorless like water. Do not use it if it is cloudy, thickened, colored, or has solid particles in it. It is important that you put your used needles and syringes in a special sharps container. Do not put them in a trash can. If you do not have a sharps container, call your pharmacist or healthcare provider to get one. Talk to your pediatrician regarding the use of this medicine in children. Special care may be needed. Overdosage: If you think you have taken too much of this medicine contact a poison control center or emergency room at once. NOTE: This medicine is only for you. Do not share this medicine with others. What if I miss a dose? It is important not to miss a dose. Your health care professional or doctor should discuss a plan for missed doses with you. If you do miss a dose, follow their plan. Do not take double doses. What may interact with this medicine? -other medicines for diabetes Many medications may cause changes in blood sugar, these include: -alcohol containing beverages -antiviral medicines for HIV or AIDS -aspirin and aspirin-like drugs -certain medicines for blood pressure, heart disease, irregular heart beat -chromium -diuretics -female hormones, such as estrogens or progestins, birth control pills -fenofibrate -gemfibrozil -isoniazid -lanreotide -female hormones or anabolic steroids -MAOIs like Carbex, Eldepryl, Marplan, Nardil,  and Parnate -medicines for weight loss -medicines for allergies, asthma, cold, or cough -medicines for depression, anxiety, or psychotic disturbances -niacin -nicotine -NSAIDs, medicines for pain and inflammation, like ibuprofen or naproxen -octreotide -pasireotide -pentamidine -phenytoin -probenecid -quinolone antibiotics such as ciprofloxacin, levofloxacin, ofloxacin -some herbal dietary supplements -steroid medicines such as prednisone or cortisone -sulfamethoxazole; trimethoprim -thyroid hormones Some medications can hide the  warning symptoms of low blood sugar (hypoglycemia). You may need to monitor your blood sugar more closely if you are taking one of these medications. These include: -beta-blockers, often used for high blood pressure or heart problems (examples include atenolol, metoprolol, propranolol) -clonidine -guanethidine -reserpine This list may not describe all possible interactions. Give your health care provider a list of all the medicines, herbs, non-prescription drugs, or dietary supplements you use. Also tell them if you smoke, drink alcohol, or use illegal drugs. Some items may interact with your medicine. What should I watch for while using this medicine? Visit your health care professional or doctor for regular checks on your progress. Do not drive, use machinery, or do anything that needs mental alertness until you know how this medicine affects you. Alcohol may interfere with the effect of this medicine. Avoid alcoholic drinks. A test called the HbA1C (A1C) will be monitored. This is a simple blood test. It measures your blood sugar control over the last 2 to 3 months. You will receive this test every 3 to 6 months. Learn how to check your blood sugar. Learn the symptoms of low and high blood sugar and how to manage them. Always carry a quick-source of sugar with you in case you have symptoms of low blood sugar. Examples include hard sugar candy or glucose tablets. Make sure others know that you can choke if you eat or drink when you develop serious symptoms of low blood sugar, such as seizures or unconsciousness. They must get medical help at once. Tell your doctor or health care professional if you have high blood sugar. You might need to change the dose of your medicine. If you are sick or exercising more than usual, you might need to change the dose of your medicine. Do not skip meals. Ask your doctor or health care professional if you should avoid alcohol. Many nonprescription cough and cold  products contain sugar or alcohol. These can affect blood sugar. Make sure that you have the right kind of syringe for the type of insulin you use. Try not to change the brand and type of insulin or syringe unless your health care professional or doctor tells you to. Switching insulin brand or type can cause dangerously high or low blood sugar. Always keep an extra supply of insulin, syringes, and needles on hand. Use a syringe one time only. Throw away syringe and needle in a closed container to prevent accidental needle sticks. Insulin pens and cartridges should never be shared. Even if the needle is changed, sharing may result in passing of viruses like hepatitis or HIV. Wear a medical ID bracelet or chain, and carry a card that describes your disease and details of your medicine and dosage times. What side effects may I notice from receiving this medicine? Side effects that you should report to your doctor or health care professional as soon as possible: -allergic reactions like skin rash, itching or hives, swelling of the face, lips, or tongue -breathing problems -signs and symptoms of high blood sugar such as dizziness, dry mouth, dry skin, fruity breath, nausea, stomach pain, increased hunger or  thirst, increased urination -signs and symptoms of low blood sugar such as feeling anxious, confusion, dizziness, increased hunger, unusually weak or tired, sweating, shakiness, cold, irritable, headache, blurred vision, fast heartbeat, loss of consciousness Side effects that usually do not require medical attention (report to your doctor or health care professional if they continue or are bothersome): -increase or decrease in fatty tissue under the skin due to overuse of a particular injection site -itching, burning, swelling, or rash at site where injected This list may not describe all possible side effects. Call your doctor for medical advice about side effects. You may report side effects to FDA at  1-800-FDA-1088. Where should I keep my medicine? Keep out of the reach of children. Store unopened vials in a refrigerator between 2 and 8 degrees C (36 and 46 degrees F). Do not freeze or use if the insulin has been frozen. Opened vials (vials currently in use) may be stored in the refrigerator or at room temperature, at approximately 25 degrees C (77 degrees F) or cooler. Keeping your insulin at room temperature decreases the amount of pain during injection. Once opened, your insulin can be used for 28 days. After 28 days, the vial should be thrown away. Store Lantus Dietitian in a refrigerator between 2 and 8 degrees C (36 and 46 degrees F) or at room temperature below 30 degrees C (86 degrees F). Do not freeze or use if the insulin has been frozen. Once opened, the pens should be kept at room temperature. Do not store in the refrigerator once opened. Once opened, the insulin can be used for 28 days. After 28 days, the Lantus Solostar Pen or Basaglar KwikPen should be thrown away. Store [employer] in a refrigerator between 2 and 8 degrees C (36 and 46 degrees F). Do not freeze or use if the insulin has been frozen. Once opened, the pens should be kept at room temperature below 30 degrees C (86 degrees F). Do not store in the refrigerator once opened. Once opened, the insulin can be used for 42 days. After 42 days, the [employer] Pen should be thrown away. Protect from light and excessive heat. Throw away any unused medicine after the expiration date or after the specified time for room temperature storage has passed. NOTE: This sheet is a summary. It may not cover all possible information. If you have questions about this medicine, talk to your doctor, pharmacist, or health care provider.  2018 Elsevier/Gold Standard (2016-09-17 10:26:25)

## 2016-12-26 NOTE — Progress Notes (Signed)
New patient presents for a physical  Pt complains of pain in her back and right shoulder and right side of neck- 6 Pt states she is unable to check her glucose levels at home, her glucometer is broken, may need a new one

## 2016-12-27 LAB — LIPID PANEL
CHOL/HDL RATIO: 3.4 ratio (ref 0.0–4.4)
Cholesterol, Total: 162 mg/dL (ref 100–199)
HDL: 48 mg/dL (ref >39)
LDL CALC: 92 mg/dL (ref 0–99)
TRIGLYCERIDES: 110 mg/dL (ref 0–149)
VLDL Cholesterol Cal: 22 mg/dL (ref 5–40)

## 2017-01-05 ENCOUNTER — Other Ambulatory Visit (INDEPENDENT_AMBULATORY_CARE_PROVIDER_SITE_OTHER): Payer: Self-pay | Admitting: Physician Assistant

## 2017-01-05 DIAGNOSIS — Z760 Encounter for issue of repeat prescription: Secondary | ICD-10-CM

## 2017-01-05 MED ORDER — PEN NEEDLES 30G X 8 MM MISC
1.0000 | Freq: Every day | 5 refills | Status: DC
Start: 2017-01-05 — End: 2017-01-06

## 2017-01-05 NOTE — Progress Notes (Signed)
Requested pen needles.

## 2017-01-06 ENCOUNTER — Other Ambulatory Visit (INDEPENDENT_AMBULATORY_CARE_PROVIDER_SITE_OTHER): Payer: Self-pay | Admitting: Physician Assistant

## 2017-01-06 DIAGNOSIS — E119 Type 2 diabetes mellitus without complications: Secondary | ICD-10-CM

## 2017-01-06 DIAGNOSIS — Z794 Long term (current) use of insulin: Principal | ICD-10-CM

## 2017-01-06 MED ORDER — INSULIN PEN NEEDLE 32G X 6 MM MISC: 1.0000 | Freq: Every day | 1 refills | Status: DC

## 2017-01-06 NOTE — Progress Notes (Signed)
Request for BD ultra fine pen needles.

## 2017-01-26 ENCOUNTER — Ambulatory Visit (INDEPENDENT_AMBULATORY_CARE_PROVIDER_SITE_OTHER): Payer: Self-pay | Admitting: Physician Assistant

## 2017-02-09 ENCOUNTER — Emergency Department (HOSPITAL_COMMUNITY)
Admission: EM | Admit: 2017-02-09 | Discharge: 2017-02-09 | Disposition: A | Discharge status: Home / Self Care | Payer: BC Managed Care – PPO | Attending: Emergency Medicine | Admitting: Emergency Medicine

## 2017-02-09 ENCOUNTER — Encounter (HOSPITAL_COMMUNITY): Payer: Self-pay | Admitting: Emergency Medicine

## 2017-02-09 ENCOUNTER — Emergency Department (HOSPITAL_COMMUNITY): Payer: BC Managed Care – PPO

## 2017-02-09 DIAGNOSIS — E119 Type 2 diabetes mellitus without complications: Secondary | ICD-10-CM | POA: Diagnosis not present

## 2017-02-09 DIAGNOSIS — Z79899 Other long term (current) drug therapy: Secondary | ICD-10-CM | POA: Diagnosis not present

## 2017-02-09 DIAGNOSIS — Y999 Unspecified external cause status: Secondary | ICD-10-CM | POA: Diagnosis not present

## 2017-02-09 DIAGNOSIS — Z87891 Personal history of nicotine dependence: Secondary | ICD-10-CM | POA: Insufficient documentation

## 2017-02-09 DIAGNOSIS — Y939 Activity, unspecified: Secondary | ICD-10-CM | POA: Insufficient documentation

## 2017-02-09 DIAGNOSIS — Y929 Unspecified place or not applicable: Secondary | ICD-10-CM | POA: Diagnosis not present

## 2017-02-09 DIAGNOSIS — Z7982 Long term (current) use of aspirin: Secondary | ICD-10-CM | POA: Insufficient documentation

## 2017-02-09 DIAGNOSIS — J45909 Unspecified asthma, uncomplicated: Secondary | ICD-10-CM | POA: Diagnosis not present

## 2017-02-09 DIAGNOSIS — I10 Essential (primary) hypertension: Secondary | ICD-10-CM | POA: Diagnosis not present

## 2017-02-09 DIAGNOSIS — Z794 Long term (current) use of insulin: Secondary | ICD-10-CM | POA: Insufficient documentation

## 2017-02-09 DIAGNOSIS — W1830XA Fall on same level, unspecified, initial encounter: Secondary | ICD-10-CM | POA: Diagnosis not present

## 2017-02-09 DIAGNOSIS — S199XXA Unspecified injury of neck, initial encounter: Secondary | ICD-10-CM | POA: Diagnosis present

## 2017-02-09 DIAGNOSIS — S161XXA Strain of muscle, fascia and tendon at neck level, initial encounter: Secondary | ICD-10-CM | POA: Insufficient documentation

## 2017-02-09 LAB — CBG MONITORING, ED: Glucose-Capillary: 187 mg/dL — ABNORMAL HIGH (ref 65–99)

## 2017-02-09 NOTE — ED Triage Notes (Signed)
Pt verbalizes headache and neck pain post chair falling apart while sitting on it around 1400 today; pt denies blood thinner use or LOC. C collar applied.

## 2017-02-09 NOTE — ED Notes (Signed)
Spoke with Adela LankFloyd MD regarding pt status and complaint; see verbal orders.

## 2017-02-09 NOTE — ED Provider Notes (Signed)
[facility] DEPT Provider Note   CSN: [id number] Arrival date & time: 02/09/17  1245     History   Chief Complaint Chief Complaint  Patient presents with  . Fall    HPI Cassandra Gould is a 61 y.o. female.  HPI Patient was at a family reunion 2 days ago seated in a chair that collapsed. She fell backwards and hit her head on a brick wall and fell to the ground. She denies loss of consciousness. She reports that she's had pain ever since in the right side of her neck down towards her right shoulder and her right lower back. She denies any numbness or tingling in her arms or her legs. She has not had weakness in the extremities. No ongoing nausea or confusion. She has history of cervical fusion.  Past Medical History:  Diagnosis Date  . Asthma    bronchites  . Bronchitis   . Diabetes mellitus   . Hypertension     Patient Active Problem List   Diagnosis Date Noted  . Acute perforated appendicitis s/p lap appendectomy 08/31/2016 08/31/2016  . Seasonal allergies 08/31/2016  . Obesity (BMI 30-39.9) 08/31/2016  . Migraines 08/31/2016  . Hypertension   . Poorly controlled type 2 diabetes mellitus ([place])   . Asthma     Past Surgical History:  Procedure Laterality Date  . LAPAROSCOPIC APPENDECTOMY N/A 08/31/2016   Procedure: APPENDECTOMY LAPAROSCOPIC;  Surgeon: [name], MD;  Location: [facility] ORS;  Service: General;  Laterality: N/A;  . TUBAL LIGATION      OB History    No data available       Home Medications    Prior to Admission medications   Medication Sig Start Date End Date Taking? Authorizing Provider  aspirin-acetaminophen-caffeine (EXCEDRIN MIGRAINE) [phone] MG per tablet Take 1 tablet by mouth every 6 (six) hours as needed. For migraine    [provider]  blood glucose meter kit and supplies KIT Dispense based on patient and insurance preference. Use up to four times daily as directed. (FOR ICD-9 250.00, 250.01). 12/26/16   Clent Demark,  PA-C  cetirizine (ZYRTEC) 10 MG tablet Take 10 mg by mouth daily.    [provider]  gabapentin (NEURONTIN) 300 MG capsule Take 1 capsule (300 mg total) by mouth 3 (three) times daily. 12/26/16   Clent Demark, PA-C  glimepiride (AMARYL) 4 MG tablet Take 1 tablet (4 mg total) by mouth daily with breakfast. 12/26/16   Clent Demark, PA-C  insulin glargine (LANTUS) 100 unit/mL SOPN Inject 0.25 mLs (25 Units total) into the skin at bedtime. 12/26/16   Clent Demark, PA-C  Insulin Pen Needle (BD ULTRA-FINE MICRO PEN NEEDLE) 32G X 6 MM MISC 1 each by Does not apply route at bedtime. 01/06/17   Clent Demark, PA-C  lisinopril-hydrochlorothiazide (ZESTORETIC) 20-12.5 MG tablet Take 2 tablets by mouth daily. 12/26/16   Clent Demark, PA-C  metFORMIN (GLUCOPHAGE) 1000 MG tablet Take 1 tablet (1,000 mg total) by mouth 2 (two) times daily with a meal. 12/26/16   Clent Demark, PA-C  naproxen (NAPROSYN) 500 MG tablet Take 1 tablet (500 mg total) by mouth 2 (two) times daily with a meal. 11/07/16   Larene Pickett, PA-C  simvastatin (ZOCOR) 40 MG tablet Take 40 mg by mouth every evening.    [provider]    Family History Family History  Problem Relation Age of Onset  . Hypertension Mother   . Diabetes Mother   .  Diabetes Sister     Social History Social History  Substance Use Topics  . Smoking status: Former Research scientist (life sciences)  . Smokeless tobacco: Never Used  . Alcohol use No     Allergies   Oxycodone   Review of Systems Review of Systems  10 Systems reviewed and are negative for acute change except as noted in the HPI.   Physical Exam Updated Vital Signs BP (!) 138/119 (BP Location: Left Arm)   Pulse 77   Temp 98.3 F (36.8 C) (Oral)   Resp 16   Ht '5\' 10"'  (1.778 m)   Wt 119.3 kg (263 lb)   SpO2 100%   BMI 37.74 kg/m   Physical Exam  Constitutional: She is oriented to person, place, and time. She appears well-developed and well-nourished. No  distress.  Patient is alert and nontoxic. Clear mental status. No respiratory distress.  HENT:  Head: Normocephalic and atraumatic.  Right Ear: External ear normal.  Left Ear: External ear normal.  Nose: Nose normal.  Mouth/Throat: Oropharynx is clear and moist.  Eyes: Conjunctivae and EOM are normal. Pupils are equal, round, and reactive to light.  Neck: Neck supple.  Paracervical muscle tenderness on the right side from about C4 down the entirety of the back. This also extends to the subscapularis area. No palpable anomaly. No visible contusion or abrasion.  Cardiovascular: Normal rate and regular rhythm.   No murmur heard. Pulmonary/Chest: Effort normal and breath sounds normal. No respiratory distress.  Abdominal: Soft. There is no tenderness.  Musculoskeletal: She exhibits no edema or deformity.  Normal range of motion of the right upper extremity. Patient does endorse some increased discomfort in the top of her shoulder with complete extension. Extremity strength intact. Normal Vascular exam.  Neurological: She is alert and oriented to person, place, and time. No cranial nerve deficit. She exhibits normal muscle tone. Coordination normal.  Skin: Skin is warm and dry.  Psychiatric: She has a normal mood and affect.  Nursing note and vitals reviewed.    ED Treatments / Results  Labs (all labs ordered are listed, but only abnormal results are displayed) Labs Reviewed  CBG MONITORING, ED - Abnormal; Notable for the following:       Result Value   Glucose-Capillary 187 (*)    All other components within normal limits    EKG  EKG Interpretation None       Radiology Dg Cervical Spine Complete  Result Date: 02/09/2017 CLINICAL DATA:  Right-sided neck pain after fall and hitting head on brick wall. EXAM: CERVICAL SPINE - COMPLETE 4+ VIEW COMPARISON:  04/10/2014 FINDINGS: Intact anterior cervical disc fusion from C3 through C6 with incorporation of interbody blocks from C3  through C6. No acute fracture or dislocations. Native degenerative disc space narrowing at C6-7 with osteophytes. Intact cervical fusion hardware. No prevertebral soft tissue swelling. Atlantodental interval and craniocervical relationship appears intact. Clear lung apices. IMPRESSION: 1. Cervical disc fusion C3 through C6 without hardware complications. 2. No acute fracture or dislocation of the cervical spine. 3. 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 Electronically Signed   By: Ashley Royalty M.D.   On: 02/09/2017 14:18    Procedures Procedures (including critical care time)  Medications Ordered in ED Medications - No data to display   Initial Impression / Assessment and Plan / ED Course  I have reviewed the triage vital signs and the nursing notes.  Pertinent labs & imaging results that were available during my care of the patient were reviewed by me  and considered in my medical decision making (see chart for details).      Final Clinical Impressions(s) / ED Diagnoses   Final diagnoses:  Cervical strain, acute, initial encounter   Patient is 2 days post cervical strain. CT does not show any acute fractures or malalignments. No soft tissue swelling. Patient is neurologically intact without signs of radiculopathy. There was no loss of consciousness, patient is not on blood thinners she does not have postconcussive symptoms. Patient for she does have Robaxin and meloxicam to take for cervical pain. She has only been taking them sporadically, she will taken for the next several days and follow-up with her PCP. New Prescriptions New Prescriptions   No medications on file     Charlesetta Shanks, MD 02/09/17 [phone]

## 2017-02-16 ENCOUNTER — Inpatient Hospital Stay (INDEPENDENT_AMBULATORY_CARE_PROVIDER_SITE_OTHER): Payer: Self-pay | Admitting: Physician Assistant

## 2017-02-20 ENCOUNTER — Telehealth (INDEPENDENT_AMBULATORY_CARE_PROVIDER_SITE_OTHER): Payer: Self-pay | Admitting: Physician Assistant

## 2017-02-20 NOTE — Telephone Encounter (Signed)
Please make an appointment so I can evaluate her.

## 2017-02-20 NOTE — Telephone Encounter (Signed)
°  Per patient fell on Memorial day weekend, stated did go to hospital,  But still has headaches and back pain. Patient requesting appt. Informed no appt available at this time but can walk in and wait if someone no shows.  Per patient would like to speak to Sindy Messingoger Gomez PA regarding symptoms.  Please follow up with patient ph: [phone]

## 2017-02-20 NOTE — Telephone Encounter (Signed)
FWD to PCP. Dayvin Aber S Medea Deines, CMA  

## 2017-02-23 ENCOUNTER — Ambulatory Visit (INDEPENDENT_AMBULATORY_CARE_PROVIDER_SITE_OTHER): Payer: BC Managed Care – PPO | Admitting: Physician Assistant

## 2017-02-23 ENCOUNTER — Encounter (INDEPENDENT_AMBULATORY_CARE_PROVIDER_SITE_OTHER): Payer: Self-pay | Admitting: Physician Assistant

## 2017-02-23 VITALS — BP 144/82 | HR 78 | Temp 98.0°F | Wt 271.2 lb

## 2017-02-23 DIAGNOSIS — M79609 Pain in unspecified limb: Secondary | ICD-10-CM

## 2017-02-23 DIAGNOSIS — R519 Headache, unspecified: Secondary | ICD-10-CM

## 2017-02-23 DIAGNOSIS — M542 Cervicalgia: Secondary | ICD-10-CM | POA: Diagnosis not present

## 2017-02-23 DIAGNOSIS — R202 Paresthesia of skin: Secondary | ICD-10-CM

## 2017-02-23 DIAGNOSIS — R51 Headache: Secondary | ICD-10-CM

## 2017-02-23 MED ORDER — PREDNISONE 20 MG PO TABS: 60.0000 mg | ORAL_TABLET | Freq: Every day | ORAL | 0 refills | Status: DC

## 2017-02-23 NOTE — Patient Instructions (Signed)
Cervical Sprain A cervical sprain is a stretch or tear in one or more of the tough, cord-like tissues that connect bones (ligaments) in the neck. Cervical sprains can range from mild to severe. Severe cervical sprains can cause the spinal bones (vertebrae) in the neck to be unstable. This can lead to spinal cord damage and can result in serious nervous system problems. The amount of time that it takes for a cervical sprain to get better depends on the cause and extent of the injury. Most cervical sprains heal in 4-6 weeks. What are the causes? Cervical sprains may be caused by an injury (trauma), such as from a motor vehicle accident, a fall, or sudden forward and backward whipping movement of the head and neck (whiplash injury). Mild cervical sprains may be caused by wear and tear over time, such as from poor posture, sitting in a chair that does not provide support, or looking up or down for long periods of time. What increases the risk? The following factors may make you more likely to develop this condition:  Participating in activities that have a high risk of trauma to the neck. These include contact sports, auto racing, gymnastics, and diving.  Taking risks when driving or riding in a motor vehicle, such as speeding.  Having osteoarthritis of the spine.  Having poor strength and flexibility of the neck.  A previous neck injury.  Having poor posture.  Spending a lot of time in certain positions that put stress on the neck, such as sitting at a computer for long periods of time. What are the signs or symptoms? Symptoms of this condition include:  Pain, soreness, stiffness, tenderness, swelling, or a burning sensation in the front, back, or sides of the neck.  Sudden tightening of neck muscles that you cannot control (muscle spasms).  Pain in the shoulders or upper back.  Limited ability to move the neck.  Headache.  Dizziness.  Nausea.  Vomiting.  Weakness, numbness, or  tingling in a hand or an arm. Symptoms may develop right away after injury, or they may develop over a few days. In some cases, symptoms may go away with treatment and return (recur) over time. How is this diagnosed? This condition may be diagnosed based on:  Your medical history.  Your symptoms.  Any recent injuries or known neck problems that you have, such as arthritis in the neck.  A physical exam.  Imaging tests, such as:  X-rays.  MRI.  CT scan. How is this treated? This condition is treated by resting and icing the injured area and doing physical therapy exercises. Depending on the severity of your condition, treatment may also include:  Keeping your neck in place (immobilized) for periods of time. This may be done using:  A cervical collar. This supports your chin and the back of your head.  A cervical traction device. This is a sling that holds up your head. This removes weight and pressure from your neck, and it may help to relieve pain.  Medicines that help to relieve pain and inflammation.  Medicines that help to relax your muscles (muscle relaxants).  Surgery. This is rare. Follow these instructions at home: If you have a cervical collar:   Wear it as told by your health care provider. Do not remove the collar unless instructed by your health care provider.  Ask your health care provider before you make any adjustments to your collar.  If you have long hair, keep it outside of the collar.    Ask your health care provider if you can remove the collar for cleaning and bathing. If you are allowed to remove the collar for cleaning or bathing:  Follow instructions from your health care provider about how to remove the collar safely.  Clean the collar by wiping it with mild soap and water and drying it completely.  If your collar has removable pads, remove them every 1-2 days and wash them by hand with soap and water. Let them air-dry completely before you put  them back in the collar.  Check your skin under the collar for irritation or sores. If you see any, tell your health care provider. Managing pain, stiffness, and swelling   If directed, use a cervical traction device as told by your health care provider.  If directed, apply heat to the affected area before you do your physical therapy or as often as told by your health care provider. Use the heat source that your health care provider recommends, such as a moist heat pack or a heating pad.  Place a towel between your skin and the heat source.  Leave the heat on for 20-30 minutes.  Remove the heat if your skin turns bright red. This is especially important if you are unable to feel pain, heat, or cold. You may have a greater risk of getting burned.  If directed, put ice on the affected area:  Put ice in a plastic bag.  Place a towel between your skin and the bag.  Leave the ice on for 20 minutes, 2-3 times a day. Activity   Do not drive while wearing a cervical collar. If you do not have a cervical collar, ask your health care provider if it is safe to drive while your neck heals.  Do not drive or use heavy machinery while taking prescription pain medicine or muscle relaxants, unless your health care provider approves.  Do not lift anything that is heavier than 10 lb (4.5 kg) until your health care provider tells you that it is safe.  Rest as directed by your health care provider. Avoid positions and activities that make your symptoms worse. Ask your health care provider what activities are safe for you.  If physical therapy was prescribed, do exercises as told by your health care provider or physical therapist. General instructions   Take over-the-counter and prescription medicines only as told by your health care provider.  Do not use any products that contain nicotine or tobacco, such as cigarettes and e-cigarettes. These can delay healing. If you need help quitting, ask your  health care provider.  Keep all follow-up visits as told by your health care provider or physical therapist. This is important. How is this prevented? To prevent a cervical sprain from happening again:  Use and maintain good posture. Make any needed adjustments to your workstation to help you use good posture.  Exercise regularly as directed by your health care provider or physical therapist.  Avoid risky activities that may cause a cervical sprain. Contact a health care provider if:  You have symptoms that get worse or do not get better after 2 weeks of treatment.  You have pain that gets worse or does not get better with medicine.  You develop new, unexplained symptoms.  You have sores or irritated skin on your neck from wearing your cervical collar. Get help right away if:  You have severe pain.  You develop numbness, tingling, or weakness in any part of your body.  You cannot move   a part of your body (you have paralysis).  You have neck pain along with:  Severe dizziness.  Headache. Summary  A cervical sprain is a stretch or tear in one or more of the tough, cord-like tissues that connect bones (ligaments) in the neck.  Cervical sprains may be caused by an injury (trauma), such as from a motor vehicle accident, a fall, or sudden forward and backward whipping movement of the head and neck (whiplash injury).  Symptoms may develop right away after injury, or they may develop over a few days.  This condition is treated by resting and icing the injured area and doing physical therapy exercises. This information is not intended to replace advice given to you by your health care provider. Make sure you discuss any questions you have with your health care provider. Document Released: 06/29/2007 Document Revised: 04/30/2016 Document Reviewed: 04/30/2016 Elsevier Interactive Patient Education  2017 [employer]  

## 2017-02-23 NOTE — Progress Notes (Signed)
Subjective:  Patient ID: Cassandra Gould, female    DOB: [birth year]  Age: 61 y.o. MRN: [id number]  CC: neck pain  HPI Cassandra Gould is a 61 y.o. female with a PMH of asthma, DM2, and HTN presents with R > L cervicalgia with radiculopathy down the RUE. Radiculopathy mostly felt in the right shoulder. Patient fell on 02/07/17. Patient was seated, chair gave way, fell in straight down in a seated position. Also hit left side of head on wall that lead to hyperextension of left cervical muscles. ED visit on 02/09/17 with assessment cervical strain. XR did not show any acute fractures or malalignments. No soft tissue swelling. Prescribed Robaxin and Meloxicam which are minimally helpful. Says that she now has a frontal headache and pain in the back of neck. Relieved temporarily with Excedrin. Also has some weakness of the RUE since the fall. Does not endorse any other symptoms or complaints.     Outpatient Medications Prior to Visit  Medication Sig Dispense Refill  . blood glucose meter kit and supplies KIT Dispense based on patient and insurance preference. Use up to four times daily as directed. (FOR ICD-9 250.00, 250.01). 1 each 0  . cetirizine (ZYRTEC) 10 MG tablet Take 10 mg by mouth daily.    Marland Kitchen gabapentin (NEURONTIN) 300 MG capsule Take 1 capsule (300 mg total) by mouth 3 (three) times daily. 90 capsule 3  . glimepiride (AMARYL) 4 MG tablet Take 1 tablet (4 mg total) by mouth daily with breakfast. 90 tablet 1  . insulin glargine (LANTUS) 100 unit/mL SOPN Inject 0.25 mLs (25 Units total) into the skin at bedtime. 15 mL 5  . Insulin Pen Needle (BD ULTRA-FINE MICRO PEN NEEDLE) 32G X 6 MM MISC 1 each by Does not apply route at bedtime. 90 each 1  . lisinopril-hydrochlorothiazide (ZESTORETIC) 20-12.5 MG tablet Take 2 tablets by mouth daily. 180 tablet 1  . metFORMIN (GLUCOPHAGE) 1000 MG tablet Take 1 tablet (1,000 mg total) by mouth 2 (two) times daily with a meal. 180 tablet 3  . simvastatin (ZOCOR)  40 MG tablet Take 40 mg by mouth every evening.    Marland Kitchen aspirin-acetaminophen-caffeine (EXCEDRIN MIGRAINE) 250-250-65 MG per tablet Take 1 tablet by mouth every 6 (six) hours as needed. For migraine    . naproxen (NAPROSYN) 500 MG tablet Take 1 tablet (500 mg total) by mouth 2 (two) times daily with a meal. 30 tablet 0   No facility-administered medications prior to visit.      ROS Review of Systems  Constitutional: Negative for chills, fever and malaise/fatigue.  Eyes: Negative for blurred vision.  Respiratory: Negative for shortness of breath.   Cardiovascular: Negative for chest pain and palpitations.  Gastrointestinal: Negative for abdominal pain and nausea.  Genitourinary: Negative for dysuria and hematuria.  Musculoskeletal: Positive for neck pain. Negative for joint pain and myalgias.  Skin: Negative for rash.  Neurological: Positive for tingling. Negative for headaches.  Psychiatric/Behavioral: Negative for depression. The patient is not nervous/anxious.     Objective:  BP (!) 144/82 (BP Location: Right Arm, Patient Position: Sitting, Cuff Size: Large)   Pulse 78   Temp 98 F (36.7 C) (Oral)   Wt 271 lb 3.2 oz (123 kg)   SpO2 100%   BMI 38.91 kg/m   BP/Weight 02/23/2017 02/09/2017 0/06/9322  Systolic BP 557 322 025  Diastolic BP 82 427 73  Wt. (Lbs) 271.2 263 267.8  BMI 38.91 37.74 41.32      Physical Exam  Constitutional:  She is oriented to person, place, and time.  Well developed, obese, NAD, polite  HENT:  Head: Normocephalic and atraumatic.  Eyes: No scleral icterus.  Neck: Normal range of motion. Neck supple.  No increased muscular tonicity of the upper trapezius bilaterally.  Cardiovascular: Normal rate, regular rhythm and normal heart sounds.   Pulmonary/Chest: Effort normal and breath sounds normal.  Musculoskeletal: She exhibits no edema.  RUE without limited aROM. RUE strength 5/5 throughout.  Neurological: She is alert and oriented to person, place,  and time. No cranial nerve deficit. Coordination normal.  Skin: Skin is warm and dry. No rash noted. No erythema. No pallor.  Psychiatric: She has a normal mood and affect. Her behavior is normal. Thought content normal.  Vitals reviewed.    Assessment & Plan:   1. Cervicalgia - Begin predniSONE (DELTASONE) 20 MG tablet; Take 3 tablets (60 mg total) by mouth daily with breakfast.  Dispense: 21 tablet; Refill: 0 - XR findings 02/09/17: 1. Cervical disc fusion C3 through C6 without hardware complications. 2. No acute fracture or dislocation of the cervical spine. 3. 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  2. Paresthesia and pain of right extremity - Burning and pain along the deltoids of the RUE - Begin predniSONE (DELTASONE) 20 MG tablet; Take 3 tablets (60 mg total) by mouth daily with breakfast.  Dispense: 21 tablet; Refill: 0  3. Acute nonintractable headache, unspecified headache type - likely from cervical sprain  Meds ordered this encounter  Medications  . predniSONE (DELTASONE) 20 MG tablet    Sig: Take 3 tablets (60 mg total) by mouth daily with breakfast.    Dispense:  21 tablet    Refill:  0    Order Specific Question:   Supervising Provider    Answer:   Tresa Garter [[id number]]    Follow-up: Return in about 1 week (around 03/02/2017) for cervicalgia.   Clent Demark PA

## 2017-03-02 ENCOUNTER — Encounter (INDEPENDENT_AMBULATORY_CARE_PROVIDER_SITE_OTHER): Payer: Self-pay | Admitting: Physician Assistant

## 2017-03-02 ENCOUNTER — Ambulatory Visit (INDEPENDENT_AMBULATORY_CARE_PROVIDER_SITE_OTHER): Payer: BC Managed Care – PPO | Admitting: Physician Assistant

## 2017-03-02 VITALS — BP 133/81 | HR 80 | Temp 98.2°F | Wt 266.6 lb

## 2017-03-02 DIAGNOSIS — E119 Type 2 diabetes mellitus without complications: Secondary | ICD-10-CM

## 2017-03-02 DIAGNOSIS — M542 Cervicalgia: Secondary | ICD-10-CM | POA: Diagnosis not present

## 2017-03-02 DIAGNOSIS — R202 Paresthesia of skin: Secondary | ICD-10-CM

## 2017-03-02 DIAGNOSIS — M79609 Pain in unspecified limb: Secondary | ICD-10-CM

## 2017-03-02 LAB — GLUCOSE, POCT (MANUAL RESULT ENTRY): POC GLUCOSE: 311 mg/dL — AB (ref 70–99)

## 2017-03-02 MED ORDER — GABAPENTIN 300 MG PO CAPS: 600.0000 mg | ORAL_CAPSULE | Freq: Three times a day (TID) | ORAL | 3 refills | Status: DC

## 2017-03-02 NOTE — Progress Notes (Signed)
Subjective:  Patient ID: Cassandra Gould, female    DOB: Aug 11, 1956  Age: 61 y.o. MRN: [id number]  CC: cervicalgia and paresthesia  HPI Cassandra Gould is a 61 y.o. female with a PMH of HTN, DM2, and asthma presents to f/u on cervicalgia and paresthesia of the RUE. She was seen one week ago with painful mobility of the neck and RUE. RUE also with "burning" in the lower deltoid region. She was prescribed Prednisone and reports doing moderately better in regards to paresthesia. Cervicalgia has also improved somewhat but right rotation of the head aggravates the pain along the right upper trapezius. - XR findings 02/09/17: 1. Cervical disc fusion C3 through C6 without hardware complications. 2. No acute fracture or dislocation of the cervical spine. 3. 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 Does not endorse any other symptoms or complaints.   ROS Review of Systems  Constitutional: Negative for chills, fever and malaise/fatigue.  Eyes: Negative for blurred vision.  Respiratory: Negative for shortness of breath.   Cardiovascular: Negative for chest pain and palpitations.  Gastrointestinal: Negative for abdominal pain and nausea.  Genitourinary: Negative for dysuria and hematuria.  Musculoskeletal: Positive for neck pain. Negative for joint pain and myalgias.  Skin: Negative for rash.  Neurological: Positive for tingling. Negative for headaches.  Psychiatric/Behavioral: Negative for depression. The patient is not nervous/anxious.     Objective:  BP 133/81 (BP Location: Right Arm, Patient Position: Sitting, Cuff Size: Large)   Pulse 80   Temp 98.2 F (36.8 C) (Oral)   Wt 266 lb 9.6 oz (120.9 kg)   SpO2 99%   BMI 38.25 kg/m   BP/Weight 03/02/2017 02/23/2017 02/09/2017  Systolic BP 133 144 138  Diastolic BP 81 82 119  Wt. (Lbs) 266.6 271.2 263  BMI 38.25 38.91 37.74      Physical Exam  Constitutional: She is oriented to person, place, and time.  Well developed, obese, NAD, polite  HENT:   Head: Normocephalic and atraumatic.  Eyes: No scleral icterus.  Neck: Normal range of motion. Neck supple. No thyromegaly present.  Cardiovascular: Normal rate, regular rhythm and normal heart sounds.   Pulmonary/Chest: Effort normal and breath sounds normal.  Abdominal: Soft. Bowel sounds are normal. There is no tenderness.  Musculoskeletal: She exhibits no edema.  Neck with full aROM but pain elicited with right rotation of head at approximately 45 degrees. All provocative testing of the RUE caused mild pain but AC shear testing seemed to provoke most pain.  Neurological: She is alert and oriented to person, place, and time.  Right hand grip strength 5/5  Skin: Skin is warm and dry. No rash noted. No erythema. No pallor.  Psychiatric: She has a normal mood and affect. Her behavior is normal. Thought content normal.  Vitals reviewed.    Assessment & Plan:   1. Cervicalgia - Increase gabapentin (NEURONTIN) 300 MG capsule; Take 2 capsules (600 mg total) by mouth 3 (three) times daily.  Dispense: 180 capsule; Refill: 3 - Ambulatory referral to Physical Therapy  2. Paresthesia and pain of right extremity - Increase gabapentin (NEURONTIN) 300 MG capsule; Take 2 capsules (600 mg total) by mouth 3 (three) times daily.  Dispense: 180 capsule; Refill: 3 - DG Shoulder Right; Future - Ambulatory referral to Physical Therapy  3. Type 2 diabetes mellitus without complication, without long-term current use of insulin (HCC) - Glucose (CBG) 311 in clinic today, prednisone induced.   Meds ordered this encounter  Medications  . gabapentin (NEURONTIN) 300 MG capsule  Sig: Take 2 capsules (600 mg total) by mouth 3 (three) times daily.    Dispense:  180 capsule    Refill:  3    Order Specific Question:   Supervising Provider    Answer:   Quentin AngstJEGEDE, OLUGBEMIGA E [id number]    Follow-up: Return in about 4 weeks (around 03/30/2017) for A1c and paresthesia.Loletta Specter.   Tawana Pasch David Brystal Kildow PA

## 2017-03-02 NOTE — Patient Instructions (Signed)
Diabetes Mellitus and Food It is important for you to manage your blood sugar (glucose) level. Your blood glucose level can be greatly affected by what you eat. Eating healthier foods in the appropriate amounts throughout the day at about the same time each day will help you control your blood glucose level. It can also help slow or prevent worsening of your diabetes mellitus. Healthy eating may even help you improve the level of your blood pressure and reach or maintain a healthy weight. General recommendations for healthful eating and cooking habits include:  Eating meals and snacks regularly. Avoid going long periods of time without eating to lose weight.  Eating a diet that consists mainly of plant-based foods, such as fruits, vegetables, nuts, legumes, and whole grains.  Using low-heat cooking methods, such as baking, instead of high-heat cooking methods, such as deep frying.  Work with your dietitian to make sure you understand how to use the Nutrition Facts information on food labels. How can food affect me? Carbohydrates Carbohydrates affect your blood glucose level more than any other type of food. Your dietitian will help you determine how many carbohydrates to eat at each meal and teach you how to count carbohydrates. Counting carbohydrates is important to keep your blood glucose at a healthy level, especially if you are using insulin or taking certain medicines for diabetes mellitus. Alcohol Alcohol can cause sudden decreases in blood glucose (hypoglycemia), especially if you use insulin or take certain medicines for diabetes mellitus. Hypoglycemia can be a life-threatening condition. Symptoms of hypoglycemia (sleepiness, dizziness, and disorientation) are similar to symptoms of having too much alcohol. If your health care provider has given you approval to drink alcohol, do so in moderation and use the following guidelines:  Women should not have more than one drink per day, and men  should not have more than two drinks per day. One drink is equal to: ? 12 oz of beer. ? 5 oz of wine. ? 1 oz of hard liquor.  Do not drink on an empty stomach.  Keep yourself hydrated. Have water, diet soda, or unsweetened iced tea.  Regular soda, juice, and other mixers might contain a lot of carbohydrates and should be counted.  What foods are not recommended? As you make food choices, it is important to remember that all foods are not the same. Some foods have fewer nutrients per serving than other foods, even though they might have the same number of calories or carbohydrates. It is difficult to get your body what it needs when you eat foods with fewer nutrients. Examples of foods that you should avoid that are high in calories and carbohydrates but low in nutrients include:  Trans fats (most processed foods list trans fats on the Nutrition Facts label).  Regular soda.  Juice.  Candy.  Sweets, such as cake, pie, doughnuts, and cookies.  Fried foods.  What foods can I eat? Eat nutrient-rich foods, which will nourish your body and keep you healthy. The food you should eat also will depend on several factors, including:  The calories you need.  The medicines you take.  Your weight.  Your blood glucose level.  Your blood pressure level.  Your cholesterol level.  You should eat a variety of foods, including:  Protein. ? Lean cuts of meat. ? Proteins low in saturated fats, such as fish, egg whites, and beans. Avoid processed meats.  Fruits and vegetables. ? Fruits and vegetables that may help control blood glucose levels, such as apples,   mangoes, and yams.  Dairy products. ? Choose fat-free or low-fat dairy products, such as milk, yogurt, and cheese.  Grains, bread, pasta, and rice. ? Choose whole grain products, such as multigrain bread, whole oats, and brown rice. These foods may help control blood pressure.  Fats. ? Foods containing healthful fats, such as  nuts, avocado, olive oil, canola oil, and fish.  Does everyone with diabetes mellitus have the same meal plan? Because every person with diabetes mellitus is different, there is not one meal plan that works for everyone. It is very important that you meet with a dietitian who will help you create a meal plan that is just right for you. This information is not intended to replace advice given to you by your health care provider. Make sure you discuss any questions you have with your health care provider. Document Released: 05/29/2005 Document Revised: 02/07/2016 Document Reviewed: 07/29/2013 Elsevier Interactive Patient Education  2017 [employer]  

## 2017-03-16 ENCOUNTER — Ambulatory Visit: Payer: BC Managed Care – PPO | Attending: Physician Assistant

## 2017-03-16 DIAGNOSIS — M6281 Muscle weakness (generalized): Secondary | ICD-10-CM | POA: Insufficient documentation

## 2017-03-16 DIAGNOSIS — R252 Cramp and spasm: Secondary | ICD-10-CM

## 2017-03-16 DIAGNOSIS — M436 Torticollis: Secondary | ICD-10-CM | POA: Diagnosis present

## 2017-03-16 DIAGNOSIS — R293 Abnormal posture: Secondary | ICD-10-CM | POA: Diagnosis present

## 2017-03-16 DIAGNOSIS — M25511 Pain in right shoulder: Secondary | ICD-10-CM | POA: Diagnosis present

## 2017-03-16 DIAGNOSIS — M542 Cervicalgia: Secondary | ICD-10-CM | POA: Insufficient documentation

## 2017-03-16 NOTE — Patient Instructions (Signed)
Issued from cabinet info on posture and cervical and scapula retraction, and cervical rotation with neck flexed with instruction to not go past tightness  Every hour 2-3 reps

## 2017-03-16 NOTE — Therapy (Signed)
[facility] [address] [place], [place], [address] Phone: [phone]   Fax:  [phone]  Physical Therapy Evaluation  Patient Details  Name: Cassandra Gould MRN: [id number] Date of Birth: 07-27-1956 Referring Provider: Sindy Messing PA  Encounter Date: 03/16/2017      PT End of Session - 03/16/17 0858    Visit Number 1   Number of Visits 16   Date for PT Re-Evaluation 05/08/17   Authorization Type BCBS   PT Start Time 0845   PT Stop Time 0930   PT Time Calculation (min) 45 min   Activity Tolerance Patient tolerated treatment well   Behavior During Therapy [facility] for tasks assessed/performed      Past Medical History:  Diagnosis Date  . Asthma    bronchites  . Bronchitis   . Diabetes mellitus   . Hypertension     Past Surgical History:  Procedure Laterality Date  . LAPAROSCOPIC APPENDECTOMY N/A 08/31/2016   Procedure: APPENDECTOMY LAPAROSCOPIC;  Surgeon: Karie Soda, MD;  Location: [facility];  Service: General;  Laterality: N/A;  . TUBAL LIGATION      There were no vitals filed for this visit.       Subjective Assessment - 03/16/17 0853    Subjective She was sitting in chair and it gave away and she fell back and hit head on wall.   She has been issued meds.  She was already on pain meds.   Prior to fall neck pain on good day 3/10,   some times with activity pain incr to 5-6/10   Limitations House hold activities;Walking;Lifting  reaching   Currently in Pain? Yes   Pain Score 5    Pain Location Neck  and shoulder and upper arm RT   Pain Orientation Right   Pain Descriptors / Indicators Burning;Aching   Pain Type Acute pain  on chronic   Pain Radiating Towards RT shoulder    Pain Onset 1 to 4 weeks ago   Pain Frequency Constant   Aggravating Factors  See above  . Use of RT arm    Pain Relieving Factors Meds , rest,    Multiple Pain Sites No            [facility] PT Assessment - 03/16/17 0001      Assessment   Medical Diagnosis cervical pain   Referring Provider Sindy Messing PA   Prior Therapy She has been to PT in past for back and neck.       Precautions   Precaution Comments ACDF old C3-C6     Restrictions   Weight Bearing Restrictions No     Balance Screen   Has the patient fallen in the past 6 months Yes   How many times? 1  only fall with injury   Has the patient had a decrease in activity level because of a fear of falling?  No   Is the patient reluctant to leave their home because of a fear of falling?  No     Prior Function   Level of Independence Needs assistance with homemaking;Needs assistance with ADLs   Vocation Retired     IT consultant   Overall Cognitive Status Within Functional Limits for tasks assessed     Observation/Other Assessments   Focus on Therapeutic Outcomes (FOTO)  61% limited     Posture/Postural Control   Posture Comments Forward head and incr thoracic kyphosis     ROM / Strength   AROM / PROM /  Strength AROM;PROM;Strength     AROM   Overall AROM Comments RT shoulder motion decr comp to LT    AROM Assessment Site Cervical   Cervical Flexion 20   Cervical Extension 25   Cervical - Right Side Bend 16   Cervical - Left Side Bend 18   Cervical - Right Rotation 33   Cervical - Left Rotation 33     PROM   Overall PROM Comments Shoulder ROM normal RT and LT ,      Strength   Overall Strength Comments WNl LT shoulder RT 4+/5 generally and may be due more to pain  Abduction slghtly decr compared to other motions.      Ambulation/Gait   Gait Comments Walks slowly with SPC .             Objective measurements completed on examination: See above findings.          [facility] Adult PT Treatment/Exercise - 03/16/17 0001      Modalities   Modalities Moist Heat     Moist Heat Therapy   Number Minutes Moist Heat 15 Minutes   Moist Heat Location Cervical;Shoulder  to RT shoulder                PT Education - 03/16/17  0924    Education provided Yes   Education Details POC ,  HEP   Person(s) Educated Patient   Methods Explanation;Demonstration;Tactile cues;Verbal cues;Handout   Comprehension Returned demonstration;Verbalized understanding          PT Short Term Goals - 03/16/17 0929      PT SHORT TERM GOAL #1   Title She will be independent with inital HEP   Time 4   Period Weeks   Status New     PT SHORT TERM GOAL #2   Title She will report pain decreased 40% or more    Time 4   Period Weeks   Status New     PT SHORT TERM GOAL #3   Title she will improve cervical rotation to 45 degrees RT/LT   Time 4   Period Weeks   Status New           PT Long Term Goals - 03/16/17 0930      PT LONG TERM GOAL #1   Title she will be independent with all HEP issued    Time 8   Period Weeks   Status New     PT LONG TERM GOAL #2   Title She will return to pre fall level of neck pain   Time 8   Period Weeks   Status New     PT LONG TERM GOAL #3   Title She will report pain in RT shoulder decreased 75% or more and be intermittant   Time 8   Period Weeks   Status New     PT LONG TERM GOAL #4   Title she will report return to normal home tasks due to decreased pain   Time 8   Period Weeks   Status New     PT LONG TERM GOAL #5   Title FOTO score will decreased  to 43% or better to indicate improved function with decr pain.    Time 8   Period Weeks   Status New                Plan - 03/16/17 0925    Clinical Impression Statement Ms Roel CluckHicklin presents with acute on chronic  neck pain with pain into RT shoulder. from fall out of chair.   She had pain levels of 3/10 generally prior to fall in neck and was using meds for this.    She has decr ROM and pain  with movement in neck and  normal motion RT shouldr but with pain     History and Personal Factors relevant to plan of care: chronic neck and back pain. previous cervical and lumbar fusion.    Clinical Presentation Unstable    Clinical Decision Making Moderate   Rehab Potential Good   PT Frequency 2x / week   PT Duration 8 weeks   PT Treatment/Interventions Cryotherapy;Iontophoresis 4mg /ml Dexamethasone;Moist Heat;Ultrasound;Passive range of motion;Patient/family education;Manual techniques;Therapeutic activities;Therapeutic exercise;Dry needling   PT Next Visit Plan MAnual , review HEP , heat, add to HEP if appropriate   PT Home Exercise Plan Cervical rotation , posture, thoracic and scapula retraction   Consulted and Agree with Plan of Care Patient      Patient will benefit from skilled therapeutic intervention in order to improve the following deficits and impairments:  Decreased range of motion, Difficulty walking, Pain, Decreased activity tolerance, Postural dysfunction, Decreased strength, Increased muscle spasms, Obesity  Visit Diagnosis: Cervicalgia  Acute pain of right shoulder  Stiffness of neck  Cramp and spasm  Abnormal posture  Muscle weakness (generalized)     Problem List Patient Active Problem List   Diagnosis Date Noted  . Acute perforated appendicitis s/p lap appendectomy 08/31/2016 08/31/2016  . Seasonal allergies 08/31/2016  . Obesity (BMI 30-39.9) 08/31/2016  . Migraines 08/31/2016  . Hypertension   . Poorly controlled type 2 diabetes mellitus (HCC)   . Asthma     Caprice Red  PT 03/16/2017, 9:36 AM  [facility] [address] [place], [place], [address] Phone: [phone]   Fax:  [phone]  Name: Anntionette Madkins MRN: [id number] Date of Birth: 02-28-1956

## 2017-03-24 ENCOUNTER — Ambulatory Visit: Payer: BC Managed Care – PPO

## 2017-03-26 ENCOUNTER — Ambulatory Visit: Payer: BC Managed Care – PPO

## 2017-03-26 DIAGNOSIS — M436 Torticollis: Secondary | ICD-10-CM

## 2017-03-26 DIAGNOSIS — M25511 Pain in right shoulder: Secondary | ICD-10-CM

## 2017-03-26 DIAGNOSIS — M542 Cervicalgia: Secondary | ICD-10-CM

## 2017-03-26 DIAGNOSIS — M6281 Muscle weakness (generalized): Secondary | ICD-10-CM

## 2017-03-26 DIAGNOSIS — R252 Cramp and spasm: Secondary | ICD-10-CM

## 2017-03-26 DIAGNOSIS — R293 Abnormal posture: Secondary | ICD-10-CM

## 2017-03-26 NOTE — Therapy (Signed)
[facility] [address] [place], [place], [address] Phone: [phone]   Fax:  [phone]  Physical Therapy Treatment  Patient Details  Name: Cassandra Gould MRN: [id number] Date of Birth: 03-01-1956 Referring Provider: Sindy Messing PA  Encounter Date: 03/26/2017      PT End of Session - 03/26/17 0849    Visit Number 2   Number of Visits 16   Date for PT Re-Evaluation 05/08/17   Authorization Type BCBS   PT Start Time 0847   PT Stop Time 0935   PT Time Calculation (min) 48 min   Activity Tolerance Patient tolerated treatment well   Behavior During Therapy [facility] for tasks assessed/performed      Past Medical History:  Diagnosis Date  . Asthma    bronchites  . Bronchitis   . Diabetes mellitus   . Hypertension     Past Surgical History:  Procedure Laterality Date  . LAPAROSCOPIC APPENDECTOMY N/A 08/31/2016   Procedure: APPENDECTOMY LAPAROSCOPIC;  Surgeon: Cassandra Soda, MD;  Location: [facility];  Service: General;  Laterality: N/A;  . TUBAL LIGATION      There were no vitals filed for this visit.      Subjective Assessment - 03/26/17 0850    Subjective Doing a little better with a little less pain   Currently in Pain? Yes   Pain Score 5    Pain Location Neck  and RT shoulder   Pain Orientation Right   Pain Descriptors / Indicators Aching;Burning   Pain Type Acute pain   Pain Onset More than a month ago   Pain Frequency Constant   Aggravating Factors  using RT arm   Pain Relieving Factors rest , meds                         [facility] Adult PT Treatment/Exercise - 03/26/17 0001      Exercises   Exercises Neck     Neck Exercises: Seated   Other Seated Exercise Reviewed postural exercises and neck rotation  done well by pt with only minor tactile cue     Modalities   Modalities Electrical Stimulation;Moist Heat;Ultrasound     Moist Heat Therapy   Number Minutes Moist Heat 10 Minutes   Moist  Heat Location Cervical;Shoulder     Ultrasound   Ultrasound Location RT neck to shoulder   Ultrasound Parameters 100% 1.5 Wcm2 !1 Mhz   Ultrasound Goals Pain     Manual Therapy   Manual Therapy Joint mobilization;Soft tissue mobilization   Joint Mobilization Gentle PA glides to C6-7 an  upper throacic T1-3   Soft tissue mobilization With tool RT > LT down to shoulder  .                   PT Short Term Goals - 03/16/17 0929      PT SHORT TERM GOAL #1   Title She will be independent with inital HEP   Time 4   Period Weeks   Status New     PT SHORT TERM GOAL #2   Title She will report pain decreased 40% or more    Time 4   Period Weeks   Status New     PT SHORT TERM GOAL #3   Title she will improve cervical rotation to 45 degrees RT/LT   Time 4   Period Weeks   Status New  PT Long Term Goals - 03/16/17 0930      PT LONG TERM GOAL #1   Title she will be independent with all HEP issued    Time 8   Period Weeks   Status New     PT LONG TERM GOAL #2   Title She will return to pre fall level of neck pain   Time 8   Period Weeks   Status New     PT LONG TERM GOAL #3   Title She will report pain in RT shoulder decreased 75% or more and be intermittant   Time 8   Period Weeks   Status New     PT LONG TERM GOAL #4   Title she will report return to normal home tasks due to decreased pain   Time 8   Period Weeks   Status New     PT LONG TERM GOAL #5   Title FOTO score will decreased  to 43% or better to indicate improved function with decr pain.    Time 8   Period Weeks   Status New             Patient will benefit from skilled therapeutic intervention in order to improve the following deficits and impairments:     Visit Diagnosis: Cervicalgia  Acute pain of right shoulder  Stiffness of neck  Cramp and spasm  Abnormal posture  Muscle weakness (generalized)     Problem List Patient Active Problem List   Diagnosis  Date Noted  . Acute perforated appendicitis s/p lap appendectomy 08/31/2016 08/31/2016  . Seasonal allergies 08/31/2016  . Obesity (BMI 30-39.9) 08/31/2016  . Migraines 08/31/2016  . Hypertension   . Poorly controlled type 2 diabetes mellitus (HCC)   . Asthma     Cassandra Gould, Cassandra Gould  PT 03/26/2017, 9:20 AM  [facility] [address] [place], [place], [address] Phone: [phone]   Fax:  [phone]  Name: Cassandra GillisShirley Gould MRN: [id number] Date of Birth: 08/02/1956

## 2017-03-30 ENCOUNTER — Encounter (INDEPENDENT_AMBULATORY_CARE_PROVIDER_SITE_OTHER): Payer: Self-pay | Admitting: Physician Assistant

## 2017-03-30 ENCOUNTER — Ambulatory Visit (INDEPENDENT_AMBULATORY_CARE_PROVIDER_SITE_OTHER): Payer: BC Managed Care – PPO | Admitting: Physician Assistant

## 2017-03-30 VITALS — BP 129/80 | HR 65 | Temp 98.1°F | Wt 267.0 lb

## 2017-03-30 DIAGNOSIS — M542 Cervicalgia: Secondary | ICD-10-CM | POA: Diagnosis not present

## 2017-03-30 DIAGNOSIS — Z794 Long term (current) use of insulin: Secondary | ICD-10-CM | POA: Diagnosis not present

## 2017-03-30 DIAGNOSIS — E119 Type 2 diabetes mellitus without complications: Secondary | ICD-10-CM | POA: Diagnosis not present

## 2017-03-30 DIAGNOSIS — R202 Paresthesia of skin: Secondary | ICD-10-CM | POA: Diagnosis not present

## 2017-03-30 DIAGNOSIS — M79609 Pain in unspecified limb: Secondary | ICD-10-CM

## 2017-03-30 LAB — POCT GLYCOSYLATED HEMOGLOBIN (HGB A1C): Hemoglobin A1C: 10.8

## 2017-03-30 MED ORDER — EXENATIDE ER 2 MG ~~LOC~~ PEN: 2.0000 mg | PEN_INJECTOR | SUBCUTANEOUS | 5 refills | Status: DC

## 2017-03-30 MED ORDER — INSULIN GLARGINE 100 UNITS/ML SOLOSTAR PEN: 45.0000 [IU] | PEN_INJECTOR | Freq: Every day | SUBCUTANEOUS | 5 refills | Status: DC

## 2017-03-30 NOTE — Patient Instructions (Addendum)
Increase Lantus gradually from 25 units before bedtime to 45 units before bedtime. You should increase by one unit every three days until you reach 45 units.   Exenatide injection suspension, extended-release What is this medicine? EXENATIDE (ex EN a tide) is used to improve blood sugar control in adults with type 2 diabetes. This medicine may be used with other oral diabetes medicines. This medicine may be used for other purposes; ask your health care provider or pharmacist if you have questions. COMMON BRAND NAME(S): Bydureon, Bydureon BCise What should I tell my health care provider before I take this medicine? They need to know if you have any of these conditions: -endocrine tumors (MEN 2) or if someone in your family had these tumors -history of pancreatitis -kidney disease or if you are on dialysis -stomach or intestine problems -thyroid cancer or if someone in your family had thyroid cancer -an unusual or allergic reaction to exenatide, medicines, foods, dyes, or preservatives -pregnant or trying to get pregnant -breast-feeding How should I use this medicine? This medicine is for injection under the skin of your upper leg, stomach area, or upper arm. It is usually given once every week (every 7 days). You will be taught how to prepare and give this medicine. Use exactly as directed. Take your medicine at regular intervals. Do not take it more often than directed. It is important that you put your used needles and syringes in a special sharps container. Do not put them in a trash can. If you do not have a sharps container, call your pharmacist or healthcare provider to get one. A special MedGuide will be given to you by the pharmacist with each prescription and refill. Be sure to read this information carefully each time. Talk to your pediatrician regarding the use of this medicine in children. Special care may be needed. Overdosage: If you think you have taken too much of this medicine  contact a poison control center or emergency room at once. NOTE: This medicine is only for you. Do not share this medicine with others. What if I miss a dose? If you miss a dose, take it as soon as you can, provided your next usual scheduled dose is due at least 3 days later. If you miss a dose and your next usual scheduled dose is due 1 or 2 days later, then do not take the missed dose. Take the next dose at your regular time. Do not take double or extra doses. If you have questions about a missed dose, contact your health care provider for advice. What may interact with this medicine? -acetaminophen -birth control pills -digoxin -insulin and other medicines for diabetes -lisinopril -lovastatin -warfarin Many medications may cause changes in blood sugar, these include: -alcohol containing beverages -antiviral medicines for HIV or AIDS -aspirin and aspirin-like drugs -certain medicines for blood pressure, heart disease, irregular heart beat -chromium -diuretics -female hormones, such as estrogens or progestins, birth control pills -fenofibrate -gemfibrozil -isoniazid -lanreotide -female hormones or anabolic steroids -MAOIs like Carbex, Eldepryl, Marplan, Nardil, and Parnate -medicines for weight loss -medicines for allergies, asthma, cold, or cough -medicines for depression, anxiety, or psychotic disturbances -niacin -nicotine -NSAIDs, medicines for pain and inflammation, like ibuprofen or naproxen -octreotide -pasireotide -pentamidine -phenytoin -probenecid -quinolone antibiotics such as ciprofloxacin, levofloxacin, ofloxacin -some herbal dietary supplements -steroid medicines such as prednisone or cortisone -sulfamethoxazole; trimethoprim -thyroid hormones Some medications can hide the warning symptoms of low blood sugar (hypoglycemia). You may need to monitor your blood sugar more  closely if you are taking one of these medications. These include: -beta-blockers, often used  for high blood pressure or heart problems (examples include atenolol, metoprolol, propranolol) -clonidine -guanethidine -reserpine This list may not describe all possible interactions. Give your health care provider a list of all the medicines, herbs, non-prescription drugs, or dietary supplements you use. Also tell them if you smoke, drink alcohol, or use illegal drugs. Some items may interact with your medicine. What should I watch for while using this medicine? Visit your doctor or health care professional for regular checks on your progress. A test called the HbA1C (A1C) will be monitored. This is a simple blood test. It measures your blood sugar control over the last 2 to 3 months. You will receive this test every 3 to 6 months. Learn how to check your blood sugar. Learn the symptoms of low and high blood sugar and how to manage them. Always carry a quick-source of sugar with you in case you have symptoms of low blood sugar. Examples include hard sugar candy or glucose tablets. Make sure others know that you can choke if you eat or drink when you develop serious symptoms of low blood sugar, such as seizures or unconsciousness. They must get medical help at once. Tell your doctor or health care professional if you have high blood sugar. You might need to change the dose of your medicine. If you are sick or exercising more than usual, you might need to change the dose of your medicine. Do not skip meals. Ask your doctor or health care professional if you should avoid alcohol. Many nonprescription cough and cold products contain sugar or alcohol. These can affect blood sugar. Pens should never be shared. Even if the needle is changed, sharing may result in passing of viruses like hepatitis or HIV. Wear a medical ID bracelet or chain, and carry a card that describes your disease and details of your medicine and dosage times. What side effects may I notice from receiving this medicine? Side effects  that you should report to your doctor or health care professional as soon as possible: -allergic reactions like skin rash, itching or hives, swelling of the face, lips, or tongue -breathing problems -diarrhea that continues or is severe -lump or swelling on the neck -severe nausea -signs and symptoms of low blood sugar such as feeling anxious, confusion, dizziness, increased hunger, unusually weak or tired, sweating, shakiness, cold, irritable, headache, blurred vision, fast heartbeat, loss of consciousness -signs and symptoms of kidney injury like trouble passing urine or change in the amount of urine -trouble swallowing -unusual stomach upset or pain -vomiting Side effects that usually do not require medical attention (report these to your doctor or health care professional if they continue or are bothersome): -constipation -diarrhea -dizziness -headache -nausea -pain, redness, or irritation at site where injected -stomach upset This list may not describe all possible side effects. Call your doctor for medical advice about side effects. You may report side effects to FDA at 1-800-FDA-1088. Where should I keep my medicine? Keep out of the reach of children. Store this medicine in a refrigerator between 2 and 8 degrees C (36 and 46 degrees F). Do not freeze. Do not use if the medicine has been frozen. Protect from light and excessive heat. Each single-dose tray can be kept at a room temperature not to exceed 25 degrees C (77 degrees F) for no more than a total of 4 weeks, if needed. Throw away any unused medicine after the  expiration date on the label. NOTE: This sheet is a summary. It may not cover all possible information. If you have questions about this medicine, talk to your doctor, pharmacist, or health care provider.  2018 Elsevier/Gold Standard (2016-09-19 17:14:47) Diabetes Mellitus and Food It is important for you to manage your blood sugar (glucose) level. Your blood glucose  level can be greatly affected by what you eat. Eating healthier foods in the appropriate amounts throughout the day at about the same time each day will help you control your blood glucose level. It can also help slow or prevent worsening of your diabetes mellitus. Healthy eating may even help you improve the level of your blood pressure and reach or maintain a healthy weight. General recommendations for healthful eating and cooking habits include:  Eating meals and snacks regularly. Avoid going long periods of time without eating to lose weight.  Eating a diet that consists mainly of plant-based foods, such as fruits, vegetables, nuts, legumes, and whole grains.  Using low-heat cooking methods, such as baking, instead of high-heat cooking methods, such as deep frying.  Work with your dietitian to make sure you understand how to use the Nutrition Facts information on food labels. How can food affect me? Carbohydrates Carbohydrates affect your blood glucose level more than any other type of food. Your dietitian will help you determine how many carbohydrates to eat at each meal and teach you how to count carbohydrates. Counting carbohydrates is important to keep your blood glucose at a healthy level, especially if you are using insulin or taking certain medicines for diabetes mellitus. Alcohol Alcohol can cause sudden decreases in blood glucose (hypoglycemia), especially if you use insulin or take certain medicines for diabetes mellitus. Hypoglycemia can be a life-threatening condition. Symptoms of hypoglycemia (sleepiness, dizziness, and disorientation) are similar to symptoms of having too much alcohol. If your health care provider has given you approval to drink alcohol, do so in moderation and use the following guidelines:  Women should not have more than one drink per day, and men should not have more than two drinks per day. One drink is equal to: ? 12 oz of beer. ? 5 oz of wine. ? 1 oz of  hard liquor.  Do not drink on an empty stomach.  Keep yourself hydrated. Have water, diet soda, or unsweetened iced tea.  Regular soda, juice, and other mixers might contain a lot of carbohydrates and should be counted.  What foods are not recommended? As you make food choices, it is important to remember that all foods are not the same. Some foods have fewer nutrients per serving than other foods, even though they might have the same number of calories or carbohydrates. It is difficult to get your body what it needs when you eat foods with fewer nutrients. Examples of foods that you should avoid that are high in calories and carbohydrates but low in nutrients include:  Trans fats (most processed foods list trans fats on the Nutrition Facts label).  Regular soda.  Juice.  Candy.  Sweets, such as cake, pie, doughnuts, and cookies.  Fried foods.  What foods can I eat? Eat nutrient-rich foods, which will nourish your body and keep you healthy. The food you should eat also will depend on several factors, including:  The calories you need.  The medicines you take.  Your weight.  Your blood glucose level.  Your blood pressure level.  Your cholesterol level.  You should eat a variety of foods, including:  Protein. ? Lean cuts of meat. ? Proteins low in saturated fats, such as fish, egg whites, and beans. Avoid processed meats.  Fruits and vegetables. ? Fruits and vegetables that may help control blood glucose levels, such as apples, mangoes, and yams.  Dairy products. ? Choose fat-free or low-fat dairy products, such as milk, yogurt, and cheese.  Grains, bread, pasta, and rice. ? Choose whole grain products, such as multigrain bread, whole oats, and brown rice. These foods may help control blood pressure.  Fats. ? Foods containing healthful fats, such as nuts, avocado, olive oil, canola oil, and fish.  Does everyone with diabetes mellitus have the same meal  plan? Because every person with diabetes mellitus is different, there is not one meal plan that works for everyone. It is very important that you meet with a dietitian who will help you create a meal plan that is just right for you. This information is not intended to replace advice given to you by your health care provider. Make sure you discuss any questions you have with your health care provider. Document Released: 05/29/2005 Document Revised: 02/07/2016 Document Reviewed: 07/29/2013 Elsevier Interactive Patient Education  2017 [employer].

## 2017-03-30 NOTE — Progress Notes (Signed)
Subjective:  Patient ID: Cassandra Gould, female    DOB: [birth year]  Age: 61 y.o. MRN: [id number]  CC: f/u A1c and paresthesia  HPI Kaliah Haddaway is a 61 y.o. female with a PMH of asthma, bronchitis, DM2, and HTN presents on f/u for DM2 and RUE paresthesia. Pt's RUE paresthesia is 25% better with increased dose of gabapentin and physical therapy. Neck pain on right side with radiculopathy is still present but less than before. There is off and on weakness to the RUE and hand grip strength noted by occasionally dropping items held with the right hand. Does not endorse any other focal neurological deficit.    In regards to DM2, patient reports glucometer reading low 104, high 400s, regular 250. Taking medications as directed. Endorses some fatigue. Does not endorse polydipsia, polyuria, polyphagia, or visual blurring.       Outpatient Medications Prior to Visit  Medication Sig Dispense Refill  . blood glucose meter kit and supplies KIT Dispense based on patient and insurance preference. Use up to four times daily as directed. (FOR ICD-9 250.00, 250.01). 1 each 0  . cetirizine (ZYRTEC) 10 MG tablet Take 10 mg by mouth daily.    Marland Kitchen gabapentin (NEURONTIN) 300 MG capsule Take 2 capsules (600 mg total) by mouth 3 (three) times daily. 180 capsule 3  . glimepiride (AMARYL) 4 MG tablet Take 1 tablet (4 mg total) by mouth daily with breakfast. 90 tablet 1  . insulin glargine (LANTUS) 100 unit/mL SOPN Inject 0.25 mLs (25 Units total) into the skin at bedtime. 15 mL 5  . Insulin Pen Needle (BD ULTRA-FINE MICRO PEN NEEDLE) 32G X 6 MM MISC 1 each by Does not apply route at bedtime. 90 each 1  . lisinopril-hydrochlorothiazide (ZESTORETIC) 20-12.5 MG tablet Take 2 tablets by mouth daily. 180 tablet 1  . metFORMIN (GLUCOPHAGE) 1000 MG tablet Take 1 tablet (1,000 mg total) by mouth 2 (two) times daily with a meal. 180 tablet 3  . simvastatin (ZOCOR) 40 MG tablet Take 40 mg by mouth every evening.    .  predniSONE (DELTASONE) 20 MG tablet Take 3 tablets (60 mg total) by mouth daily with breakfast. (Patient not taking: Reported on 03/16/2017) 21 tablet 0   No facility-administered medications prior to visit.      ROS Review of Systems  Constitutional: Negative for chills, fever and malaise/fatigue.  Eyes: Negative for blurred vision.  Respiratory: Negative for shortness of breath.   Cardiovascular: Negative for chest pain and palpitations.  Gastrointestinal: Negative for abdominal pain and nausea.  Genitourinary: Negative for dysuria and hematuria.  Musculoskeletal: Negative for joint pain and myalgias.  Skin: Negative for rash.  Neurological: Positive for tingling (RUE) and focal weakness (occasionally drops items from right hand). Negative for headaches.  Psychiatric/Behavioral: Negative for depression. The patient is not nervous/anxious.     Objective:  BP 129/80 (BP Location: Right Arm, Patient Position: Sitting, Cuff Size: Large)   Pulse 65   Temp 98.1 F (36.7 C) (Oral)   Wt 267 lb (121.1 kg)   SpO2 100%   BMI 38.31 kg/m   BP/Weight 03/30/2017 03/02/2017 0/93/2671  Systolic BP 245 809 983  Diastolic BP 80 81 82  Wt. (Lbs) 267 266.6 271.2  BMI 38.31 38.25 38.91      Physical Exam  Constitutional: She is oriented to person, place, and time.  Well developed, obese, NAD, polite  HENT:  Head: Normocephalic and atraumatic.  Eyes: No scleral icterus.  Neck: Normal range of motion.  Neck supple. No thyromegaly present.  Cardiovascular: Normal rate, regular rhythm and normal heart sounds.   Pulmonary/Chest: Effort normal and breath sounds normal.  Musculoskeletal: She exhibits no edema.  RUE with full aROM.  Neurological: She is alert and oriented to person, place, and time.  Hand grip strength 5/5 bilaterally  Skin: Skin is warm and dry. No rash noted. No erythema. No pallor.  Psychiatric: She has a normal mood and affect. Her behavior is normal. Thought content normal.   Vitals reviewed.    Assessment & Plan:   1. Type 2 diabetes mellitus without complication, with long-term current use of insulin (HCC) - Increase Lantus gradually from 25 units qhs to 45 units qhs. - Exenatide ER (BYDUREON) 2 MG PEN; Inject 2 mg into the skin once a week.  Dispense: 4 each; Refill: 5 - Comprehensive metabolic panel - HgB K9C  In clinic today  2. Paresthesia and pain of right extremity - continue on physical therapy  3. Cervicalgia - continue on physical therapy      Meds ordered this encounter  Medications  . Exenatide ER (BYDUREON) 2 MG PEN    Sig: Inject 2 mg into the skin once a week.    Dispense:  4 each    Refill:  5    Order Specific Question:   Supervising Provider    Answer:   Tresa Garter [id number]    Follow-up: Return in about 3 months (around 06/30/2017) for DM2.   Clent Demark PA

## 2017-03-31 ENCOUNTER — Encounter: Payer: Self-pay | Admitting: Physical Therapy

## 2017-03-31 ENCOUNTER — Ambulatory Visit: Payer: BC Managed Care – PPO | Admitting: Physical Therapy

## 2017-03-31 DIAGNOSIS — M542 Cervicalgia: Secondary | ICD-10-CM | POA: Diagnosis not present

## 2017-03-31 DIAGNOSIS — M436 Torticollis: Secondary | ICD-10-CM

## 2017-03-31 DIAGNOSIS — M25511 Pain in right shoulder: Secondary | ICD-10-CM

## 2017-03-31 DIAGNOSIS — R293 Abnormal posture: Secondary | ICD-10-CM

## 2017-03-31 DIAGNOSIS — R252 Cramp and spasm: Secondary | ICD-10-CM

## 2017-03-31 DIAGNOSIS — M6281 Muscle weakness (generalized): Secondary | ICD-10-CM

## 2017-03-31 LAB — COMPREHENSIVE METABOLIC PANEL
A/G RATIO: 1.4 (ref 1.2–2.2)
ALT: 17 IU/L (ref 0–32)
AST: 19 IU/L (ref 0–40)
Albumin: 4.9 g/dL — ABNORMAL HIGH (ref 3.6–4.8)
Alkaline Phosphatase: 165 IU/L — ABNORMAL HIGH (ref 39–117)
BILIRUBIN TOTAL: 0.3 mg/dL (ref 0.0–1.2)
BUN/Creatinine Ratio: 13 (ref 12–28)
BUN: 14 mg/dL (ref 8–27)
CHLORIDE: 100 mmol/L (ref 96–106)
CO2: 20 mmol/L (ref 20–29)
Calcium: 10.3 mg/dL (ref 8.7–10.3)
Creatinine, Ser: 1.08 mg/dL — ABNORMAL HIGH (ref 0.57–1.00)
GFR calc Af Amer: 64 mL/min/{1.73_m2} (ref >59)
GFR calc non Af Amer: 56 mL/min/{1.73_m2} — ABNORMAL LOW (ref >59)
Globulin, Total: 3.6 g/dL (ref 1.5–4.5)
Glucose: 180 mg/dL — ABNORMAL HIGH (ref 65–99)
POTASSIUM: 4.2 mmol/L (ref 3.5–5.2)
SODIUM: 141 mmol/L (ref 134–144)
Total Protein: 8.5 g/dL (ref 6.0–8.5)

## 2017-03-31 NOTE — Therapy (Signed)
[facility] [address] [place], [place], [address] Phone: [phone]   Fax:  [phone]  Physical Therapy Treatment  Patient Details  Name: Cassandra Gould MRN: [id number] Date of Birth: 01/17/1956 Referring Provider: Sindy Messingoger Gomez PA  Encounter Date: 03/31/2017      PT End of Session - 03/31/17 1143    Visit Number 3   Number of Visits 16   Date for PT Re-Evaluation 05/08/17   PT Start Time 0847   PT Stop Time 0945   PT Time Calculation (min) 58 min   Activity Tolerance Patient tolerated treatment well   Behavior During Therapy [facility] for tasks assessed/performed      Past Medical History:  Diagnosis Date  . Asthma    bronchites  . Bronchitis   . Diabetes mellitus   . Hypertension     Past Surgical History:  Procedure Laterality Date  . LAPAROSCOPIC APPENDECTOMY N/A 08/31/2016   Procedure: APPENDECTOMY LAPAROSCOPIC;  Surgeon: Karie SodaSteven Gross, MD;  Location: [facility];  Service: General;  Laterality: N/A;  . TUBAL LIGATION      There were no vitals filed for this visit.      Subjective Assessment - 03/31/17 0847    Subjective Keeps a piece of candy in purse in case she has a diabetic need.  She has been doing her stretches.    Currently in Pain? Yes   Pain Score 4    Pain Location Neck   Pain Orientation Right   Pain Descriptors / Indicators Aching;Burning  Gets HA all the frontal and base of skull and back of head   Pain Type Acute pain   Pain Radiating Towards to proximal deltoid,  postreior shoulder   Pain Frequency Constant   Aggravating Factors  using arm,  has pain with turning head, sleeping on shoulder longer,  reaching, picking things up with right hand.dressing   Pain Relieving Factors meds, rest, heat, uses pillows, change of position   Effect of Pain on Daily Activities limits sleeping,  extra time with dressing,  limits reaching and lifting,  limits playing with grandkids.    Multiple Pain Sites --   Lower back pain  5/10,long standing.                          [facility] Adult PT Treatment/Exercise - 03/31/17 0001      Self-Care   Posture sitting posture discussed and practiced, pillows used.     Neck Exercises: Seated   Neck Retraction 5 reps;5 secs   Neck Retraction Limitations cues chin position and breathing   Cervical Rotation 5 reps  5 second holds,  with neck flexion   Cervical Rotation Limitations Cues needed to flex her neck to target sore areas.    Other Seated Exercise YOGA trunk rotation stretch, cued for sitting on edge and technique starting at hips and lengthening spine working up back for rotation.  3 X each 5 second holds each.     Other Seated Exercise scapular retraction 5 X 5 seconds     Moist Heat Therapy   Number Minutes Moist Heat 15 Minutes   Moist Heat Location Cervical;Shoulder  sitting head against pillow on wall,  unable to get on table     Manual Therapy   Manual Therapy Soft tissue mobilization   Manual therapy comments crecial, upper traps,  scalenes, paraspinals,  instrument assist intermittantly.  Patient tolerated light pressure.  PT Education - 03/31/17 1143    Education provided Yes   Education Details Posture ed   [employer]) Educated Patient   Methods Explanation;Demonstration   Comprehension Verbalized understanding;Returned demonstration;Need further instruction          PT Short Term Goals - 03/16/17 0929      PT SHORT TERM GOAL #1   Title She will be independent with inital HEP   Time 4   Period Weeks   Status New     PT SHORT TERM GOAL #2   Title She will report pain decreased 40% or more    Time 4   Period Weeks   Status New     PT SHORT TERM GOAL #3   Title she will improve cervical rotation to 45 degrees RT/LT   Time 4   Period Weeks   Status New           PT Long Term Goals - 03/16/17 0930      PT LONG TERM GOAL #1   Title she will be independent with all HEP issued     Time 8   Period Weeks   Status New     PT LONG TERM GOAL #2   Title She will return to pre fall level of neck pain   Time 8   Period Weeks   Status New     PT LONG TERM GOAL #3   Title She will report pain in RT shoulder decreased 75% or more and be intermittant   Time 8   Period Weeks   Status New     PT LONG TERM GOAL #4   Title she will report return to normal home tasks due to decreased pain   Time 8   Period Weeks   Status New     PT LONG TERM GOAL #5   Title FOTO score will decreased  to 43% or better to indicate improved function with decr pain.    Time 8   Period Weeks   Status New               Plan - 03/31/17 1144    Clinical Impression Statement Posture ed, exercise and manual continued today.  She noted less pain post manual in neck , posterior shoulder.  She needs ongoing work with her posture.    Neck ROM/ Trunk ROM visabily improved with stretching   PT Treatment/Interventions Cryotherapy;Iontophoresis 4mg /ml Dexamethasone;Moist Heat;Ultrasound;Passive range of motion;Patient/family education;Manual techniques;Therapeutic activities;Therapeutic exercise;Dry needling   PT Next Visit Plan MAnual , review HEP , heat, add to HEP if appropriate. patient may not be able to get onto table due to her back.     PT Home Exercise Plan Cervical rotation , posture, thoracic and scapula retraction   Consulted and Agree with Plan of Care Patient      Patient will benefit from skilled therapeutic intervention in order to improve the following deficits and impairments:  Decreased range of motion, Difficulty walking, Pain, Decreased activity tolerance, Postural dysfunction, Decreased strength, Increased muscle spasms, Obesity  Visit Diagnosis: Cervicalgia  Acute pain of right shoulder  Stiffness of neck  Cramp and spasm  Abnormal posture  Muscle weakness (generalized)     Problem List Patient Active Problem List   Diagnosis Date Noted  . Acute  perforated appendicitis s/p lap appendectomy 08/31/2016 08/31/2016  . Seasonal allergies 08/31/2016  . Obesity (BMI 30-39.9) 08/31/2016  . Migraines 08/31/2016  . Hypertension   . Poorly controlled type 2 diabetes  mellitus (HCC)   . Asthma     Abrianna Sidman PTA 03/31/2017, 11:47 AM  [facility] [address] [place], [place], [address] Phone: [phone]   Fax:  [phone]  Name: Nadja Lina MRN: [id number] Date of Birth: [birth year]

## 2017-04-01 ENCOUNTER — Telehealth (INDEPENDENT_AMBULATORY_CARE_PROVIDER_SITE_OTHER): Payer: Self-pay

## 2017-04-01 NOTE — Telephone Encounter (Signed)
October is fine.  Thank you

## 2017-04-01 NOTE — Telephone Encounter (Signed)
-----   Message from Loletta Specteroger David Gomez, PA-C sent at 03/31/2017  6:05 PM EDT ----- One of the liver enzymes is elevated, this would require a redraw and further work up if still elevated. Kidney filtration is mildly reduced but similar to previous results. Keep taking prescribed medications for diabetes.

## 2017-04-01 NOTE — Telephone Encounter (Signed)
Explained results to patient she is aware of elevated enzymes. Patient is asking will she need to come in for further work up sooner than her next scheduled appointment in October?

## 2017-04-02 ENCOUNTER — Ambulatory Visit: Payer: BC Managed Care – PPO

## 2017-04-02 DIAGNOSIS — M436 Torticollis: Secondary | ICD-10-CM

## 2017-04-02 DIAGNOSIS — R252 Cramp and spasm: Secondary | ICD-10-CM

## 2017-04-02 DIAGNOSIS — M25511 Pain in right shoulder: Secondary | ICD-10-CM

## 2017-04-02 DIAGNOSIS — R293 Abnormal posture: Secondary | ICD-10-CM

## 2017-04-02 DIAGNOSIS — M6281 Muscle weakness (generalized): Secondary | ICD-10-CM

## 2017-04-02 DIAGNOSIS — M542 Cervicalgia: Secondary | ICD-10-CM | POA: Diagnosis not present

## 2017-04-02 NOTE — Therapy (Signed)
[facility] [address] [place], [place], [address] Phone: [phone]   Fax:  [phone]  Physical Therapy Treatment  Patient Details  Name: Cassandra Gould MRN: [id number] Date of Birth: [birth year] Referring Provider: Sindy Messing PA  Encounter Date: 04/02/2017      PT End of Session - 04/02/17 0853    Visit Number 4   Number of Visits 16   Date for PT Re-Evaluation 05/08/17   Authorization Type BCBS   PT Start Time [phone]   PT Stop Time 0945   PT Time Calculation (min) 58 min   Activity Tolerance Patient tolerated treatment well   Behavior During Therapy [facility] for tasks assessed/performed      Past Medical History:  Diagnosis Date  . Asthma    bronchites  . Bronchitis   . Diabetes mellitus   . Hypertension     Past Surgical History:  Procedure Laterality Date  . LAPAROSCOPIC APPENDECTOMY N/A 08/31/2016   Procedure: APPENDECTOMY LAPAROSCOPIC;  Surgeon: Karie Soda, MD;  Location: [facility];  Service: General;  Laterality: N/A;  . TUBAL LIGATION      There were no vitals filed for this visit.      Subjective Assessment - 04/02/17 0851    Subjective Some better 30%.     Currently in Pain? Yes   Pain Score 4    Pain Location Neck   Pain Orientation Right   Pain Descriptors / Indicators Aching;Burning   Pain Type Acute pain   Pain Onset More than a month ago   Pain Frequency Constant   Aggravating Factors  using arm , turning head, lying on RT side   Pain Relieving Factors meds rest, heat change position   Multiple Pain Sites --  LBP with standing                         [facility] Adult PT Treatment/Exercise - 04/02/17 0001      Neck Exercises: Seated   Neck Retraction 5 reps;5 secs   Neck Retraction Limitations cues chin position and breathing   Cervical Rotation 5 reps   Cervical Rotation Limitations Cues needed to flex her neck to target sore areas.    Other Seated Exercise YOGA trunk  rotation stretch, cued for sitting on edge and technique starting at hips and lengthening spine working up back for rotation.  adding rotation at neck 3 X each 5 second holds each.     Other Seated Exercise scapular retraction 5 X 5 seconds     Ultrasound   Ultrasound Location RT neck tp shoulder   Ultrasound Parameters 100% 1.6 Wcm2   Ultrasound Goals Pain     Manual Therapy   Manual Therapy Taping   Soft tissue mobilization With tool RT > LT down to shoulder  .    Kinesiotex Inhibit Muscle     Kinesiotix   Inhibit Muscle  scalenes and levator RT                   PT Short Term Goals - 04/02/17 0855      PT SHORT TERM GOAL #1   Title She will be independent with inital HEP   Status Achieved     PT SHORT TERM GOAL #2   Title She will report pain decreased 40% or more    Baseline 30%   Status On-going     PT SHORT TERM GOAL #3   Title she will  improve cervical rotation to 45 degrees RT/LT   Baseline 40 degrees   Status On-going           PT Long Term Goals - 03/16/17 0930      PT LONG TERM GOAL #1   Title she will be independent with all HEP issued    Time 8   Period Weeks   Status New     PT LONG TERM GOAL #2   Title She will return to pre fall level of neck pain   Time 8   Period Weeks   Status New     PT LONG TERM GOAL #3   Title She will report pain in RT shoulder decreased 75% or more and be intermittant   Time 8   Period Weeks   Status New     PT LONG TERM GOAL #4   Title she will report return to normal home tasks due to decreased pain   Time 8   Period Weeks   Status New     PT LONG TERM GOAL #5   Title FOTO score will decreased  to 43% or better to indicate improved function with decr pain.    Time 8   Period Weeks   Status New               Plan - 04/02/17 [id number]    Clinical Impression Statement No increased pain post session felt better post heat . Trial of taping RT neck.  education on removeal if skin irritaed  or 3 days and she can shower with tape.    PT Treatment/Interventions Cryotherapy;Iontophoresis 4mg /ml Dexamethasone;Moist Heat;Ultrasound;Passive range of motion;Patient/family education;Manual techniques;Therapeutic activities;Therapeutic exercise;Dry needling   PT Next Visit Plan MAnual ,  , heat, add to HEP if appropriate.   assess tpae    PT Home Exercise Plan Cervical rotation , posture, thoracic and scapula retraction   Consulted and Agree with Plan of Care Patient      Patient will benefit from skilled therapeutic intervention in order to improve the following deficits and impairments:  Decreased range of motion, Difficulty walking, Pain, Decreased activity tolerance, Postural dysfunction, Decreased strength, Increased muscle spasms, Obesity  Visit Diagnosis: Cervicalgia  Acute pain of right shoulder  Stiffness of neck  Cramp and spasm  Abnormal posture  Muscle weakness (generalized)     Problem List Patient Active Problem List   Diagnosis Date Noted  . Acute perforated appendicitis s/p lap appendectomy 08/31/2016 08/31/2016  . Seasonal allergies 08/31/2016  . Obesity (BMI 30-39.9) 08/31/2016  . Migraines 08/31/2016  . Hypertension   . Poorly controlled type 2 diabetes mellitus (HCC)   . Asthma     Caprice RedChasse, Jhovany Weidinger M  PT 04/02/2017, 9:37 AM  [facility] [address] [place], [place], [address] Phone: [phone]   Fax:  [phone]  Name: Linde GillisShirley Curvin MRN: [id number] Date of Birth: 11/14/1955

## 2017-04-07 ENCOUNTER — Ambulatory Visit: Payer: BC Managed Care – PPO

## 2017-04-07 DIAGNOSIS — M25511 Pain in right shoulder: Secondary | ICD-10-CM

## 2017-04-07 DIAGNOSIS — M542 Cervicalgia: Secondary | ICD-10-CM | POA: Diagnosis not present

## 2017-04-07 DIAGNOSIS — R293 Abnormal posture: Secondary | ICD-10-CM

## 2017-04-07 DIAGNOSIS — M436 Torticollis: Secondary | ICD-10-CM

## 2017-04-07 DIAGNOSIS — R252 Cramp and spasm: Secondary | ICD-10-CM

## 2017-04-07 NOTE — Therapy (Signed)
[facility] [address] [place], [place], [address] Phone: [phone]   Fax:  [phone]  Physical Therapy Treatment  Patient Details  Name: Cassandra GillisShirley Sachdev MRN: [id number] Date of Birth: 08/21/1956 Referring Provider: Sindy Messingoger Gomez PA  Encounter Date: 04/07/2017      PT End of Session - 04/07/17 0851    Visit Number 5   Number of Visits 16   Date for PT Re-Evaluation 05/08/17   Authorization Type BCBS   PT Start Time 0848   PT Stop Time 0940   PT Time Calculation (min) 52 min   Activity Tolerance Patient tolerated treatment well      Past Medical History:  Diagnosis Date  . Asthma    bronchites  . Bronchitis   . Diabetes mellitus   . Hypertension     Past Surgical History:  Procedure Laterality Date  . LAPAROSCOPIC APPENDECTOMY N/A 08/31/2016   Procedure: APPENDECTOMY LAPAROSCOPIC;  Surgeon: Karie SodaSteven Gross, MD;  Location: [facility];  Service: General;  Laterality: N/A;  . TUBAL LIGATION      There were no vitals filed for this visit.      Subjective Assessment - 04/07/17 0854    Subjective About the same.  She uses neck roll with sleep but limited benfits.....   Diagnostic tests None   Patient Stated Goals Reduce pain   Currently in Pain? Yes   Pain Score 4    Pain Location Neck   Pain Orientation Right   Pain Descriptors / Indicators Aching;Burning   Pain Type Acute pain   Pain Onset More than a month ago   Pain Frequency Constant   Aggravating Factors  using arm , turen head   Pain Relieving Factors meds,   rest,   heat               [facility] PT Assessment - 04/07/17 0001      Assessment   Medical Diagnosis cervical pain   Onset Date/Surgical Date --  01[place]2017 [place]   Next MD Visit none soon   Prior Therapy She has been to PT in past for back and neck.       AROM   Cervical Flexion 22   Cervical Extension 22   Cervical - Right Side Bend 20   Cervical - Left Side Bend 22   Cervical -  Right Rotation 42   Cervical - Left Rotation 43     PROM   Overall PROM Comments Shoulder ROM normal RT and LT ,                      [facility] Adult PT Treatment/Exercise - 04/07/17 0001      Ultrasound   Ultrasound Location RT neck   Ultrasound Parameters 100% 1.6 Wc2 ! Mhz   Ultrasound Goals Pain     Manual Therapy   Manual therapy comments gentle stretching Rot and Side bend   Kinesiotex Inhibit Muscle     Kinesiotix   Inhibit Muscle  scalenes and levator RT      AROM side end and rotation x 10 reps             PT Short Term Goals - 04/02/17 0855      PT SHORT TERM GOAL #1   Title She will be independent with inital HEP   Status Achieved     PT SHORT TERM GOAL #2   Title She will report pain decreased 40% or more  Baseline 30%   Status On-going     PT SHORT TERM GOAL #3   Title she will improve cervical rotation to 45 degrees RT/LT   Baseline 40 degrees   Status On-going           PT Long Term Goals - 04/07/17 0934      PT LONG TERM GOAL #1   Title she will be independent with all HEP issued    Status On-going     PT LONG TERM GOAL #2   Title She will return to pre fall level of neck pain   Status On-going     PT LONG TERM GOAL #3   Title She will report pain in RT shoulder decreased 75% or more and be intermittant   Status On-going     PT LONG TERM GOAL #4   Title she will report return to normal home tasks due to decreased pain   Status On-going     PT LONG TERM GOAL #5   Title FOTO score will decreased  to 43% or better to indicate improved function with decr pain.    Status Unable to assess               Plan - 04/07/17 0929    Clinical Impression Statement Felt better post session.  no significnt improvement over all but some increased AROM with rot and side bend.    continue x 3 and if no changes discharge   PT Treatment/Interventions Cryotherapy;Iontophoresis 4mg /ml Dexamethasone;Moist Heat;Ultrasound;Passive  range of motion;Patient/family education;Manual techniques;Therapeutic activities;Therapeutic exercise;Dry needling   PT Next Visit Plan MAnual ,  [place] , heat, add to HEP if appropriate.   tape    PT Home Exercise Plan Cervical rotation , posture, thoracic and scapula retraction   Consulted and Agree with Plan of Care Patient      Patient will benefit from skilled therapeutic intervention in order to improve the following deficits and impairments:  Decreased range of motion, Difficulty walking, Pain, Decreased activity tolerance, Postural dysfunction, Decreased strength, Increased muscle spasms, Obesity  Visit Diagnosis: Cervicalgia  Acute pain of right shoulder  Stiffness of neck  Cramp and spasm  Abnormal posture     Problem List Patient Active Problem List   Diagnosis Date Noted  . Acute perforated appendicitis s/p lap appendectomy 08/31/2016 08/31/2016  . Seasonal allergies 08/31/2016  . Obesity (BMI 30-39.9) 08/31/2016  . Migraines 08/31/2016  . Hypertension   . Poorly controlled type 2 diabetes mellitus (HCC)   . Asthma     Caprice Red  PT 04/07/2017, 9:35 AM  [facility] [address] [place], [place], [address] Phone: [phone]   Fax:  [phone]  Name: Cassandra Gould MRN: [id number] Date of Birth: May 09, 1956

## 2017-04-09 ENCOUNTER — Ambulatory Visit: Payer: BC Managed Care – PPO

## 2017-04-09 DIAGNOSIS — R252 Cramp and spasm: Secondary | ICD-10-CM

## 2017-04-09 DIAGNOSIS — M542 Cervicalgia: Secondary | ICD-10-CM

## 2017-04-09 DIAGNOSIS — M25511 Pain in right shoulder: Secondary | ICD-10-CM

## 2017-04-09 DIAGNOSIS — M6281 Muscle weakness (generalized): Secondary | ICD-10-CM

## 2017-04-09 DIAGNOSIS — R293 Abnormal posture: Secondary | ICD-10-CM

## 2017-04-09 DIAGNOSIS — M436 Torticollis: Secondary | ICD-10-CM

## 2017-04-09 NOTE — Therapy (Signed)
[facility] [address] [place], [place], [address] Phone: [phone]   Fax:  [phone]  Physical Therapy Treatment  Patient Details  Name: Cassandra Gould MRN: [id number] Date of Birth: 03/01/1956 Referring Provider: Sindy Messingoger Gomez PA  Encounter Date: 04/09/2017      PT End of Session - 04/09/17 0851    Visit Number 6   Number of Visits 16   Date for PT Re-Evaluation 05/08/17   Authorization Type BCBS   PT Start Time [id number]   PT Stop Time 0940   PT Time Calculation (min) 54 min   Activity Tolerance Patient tolerated treatment well   Behavior During Therapy [facility] for tasks assessed/performed      Past Medical History:  Diagnosis Date  . Asthma    bronchites  . Bronchitis   . Diabetes mellitus   . Hypertension     Past Surgical History:  Procedure Laterality Date  . LAPAROSCOPIC APPENDECTOMY N/A 08/31/2016   Procedure: APPENDECTOMY LAPAROSCOPIC;  Surgeon: Karie SodaSteven Gross, MD;  Location: [facility];  Service: General;  Laterality: N/A;  . TUBAL LIGATION      There were no vitals filed for this visit.      Subjective Assessment - 04/09/17 0852    Subjective Maybe a little better since Tuesday   Currently in Pain? Yes   Pain Score 4    Pain Location Neck   Pain Orientation Right   Pain Descriptors / Indicators Aching;Burning   Pain Onset More than a month ago   Pain Frequency Constant                         [facility] Adult PT Treatment/Exercise - 04/09/17 0001      Moist Heat Therapy   Number Minutes Moist Heat 15 Minutes   Moist Heat Location Cervical;Shoulder     Ultrasound   Ultrasound Location RT neck   Ultrasound Parameters 100% 1 Mhz 1.6 Wcm2   Ultrasound Goals Pain     Manual Therapy   Manual therapy comments gentle stretching Rot and Side bend   Soft tissue mobilization With tool RT > LT down to shoulder  .    Kinesiotex Inhibit Muscle     Kinesiotix   Inhibit Muscle  scalenes and  levator RT                   PT Short Term Goals - 04/09/17 0924      PT SHORT TERM GOAL #1   Title She will be independent with inital HEP   Status Achieved     PT SHORT TERM GOAL #2   Title She will report pain decreased 40% or more    Baseline 30%   Status On-going     PT SHORT TERM GOAL #3   Title she will improve cervical rotation to 45 degrees RT/LT   Baseline 42-43 degrees   Status On-going           PT Long Term Goals - 04/09/17 0925      PT LONG TERM GOAL #1   Title she will be independent with all HEP issued    Status On-going     PT LONG TERM GOAL #2   Title She will return to pre fall level of neck pain   Status On-going     PT LONG TERM GOAL #3   Title She will report pain in RT shoulder decreased 75% or more and be intermittant  Baseline 40-50%   Status On-going     PT LONG TERM GOAL #4   Title she will report return to normal home tasks due to decreased pain   Status On-going               Plan - 04/09/17 [id number]    Clinical Impression Statement 2/10 post session feels beter with treatment .  ROM still improved . Reapplies tape as she felt it was helpful   PT Treatment/Interventions Cryotherapy;Iontophoresis 4mg /ml Dexamethasone;Moist Heat;Ultrasound;Passive range of motion;Patient/family education;Manual techniques;Therapeutic activities;Therapeutic exercise;Dry needling   PT Next Visit Plan MAnual ,  US , heat, add to HEP if appropriate.   tape    PT Home Exercise Plan Cervical rotation , posture, thoracic and scapula retraction   Consulted and Agree with Plan of Care Patient      Patient will benefit from skilled therapeutic intervention in order to improve the following deficits and impairments:  Decreased range of motion, Difficulty walking, Pain, Decreased activity tolerance, Postural dysfunction, Decreased strength, Increased muscle spasms, Obesity  Visit Diagnosis: Cervicalgia  Acute pain of right shoulder  Stiffness  of neck  Cramp and spasm  Abnormal posture  Muscle weakness (generalized)     Problem List Patient Active Problem List   Diagnosis Date Noted  . Acute perforated appendicitis s/p lap appendectomy 08/31/2016 08/31/2016  . Seasonal allergies 08/31/2016  . Obesity (BMI 30-39.9) 08/31/2016  . Migraines 08/31/2016  . Hypertension   . Poorly controlled type 2 diabetes mellitus (HCC)   . Asthma     Caprice RedChasse, Zahmir Lalla M  PT 04/09/2017, 9:26 AM  [facility] [address] [place], [place], [address] Phone: [phone]   Fax:  [phone]  Name: Cassandra GillisShirley Gould MRN: [id number] Date of Birth: 06/23/1956

## 2017-04-13 ENCOUNTER — Ambulatory Visit: Payer: BC Managed Care – PPO | Admitting: Physical Therapy

## 2017-04-13 ENCOUNTER — Telehealth: Payer: Self-pay | Admitting: Physical Therapy

## 2017-04-13 NOTE — Telephone Encounter (Signed)
Left message on voice mail about missed appointment today.  Reminded her of her next appointment.  Reminded her to call to cancel appointment per the attendance policy.  Liz BeachKaren Benn Tarver PTA

## 2017-04-16 ENCOUNTER — Encounter: Payer: Self-pay | Admitting: Physical Therapy

## 2017-04-16 ENCOUNTER — Ambulatory Visit: Payer: BC Managed Care – PPO | Attending: Physician Assistant | Admitting: Physical Therapy

## 2017-04-16 DIAGNOSIS — R293 Abnormal posture: Secondary | ICD-10-CM | POA: Diagnosis present

## 2017-04-16 DIAGNOSIS — R252 Cramp and spasm: Secondary | ICD-10-CM | POA: Diagnosis present

## 2017-04-16 DIAGNOSIS — M542 Cervicalgia: Secondary | ICD-10-CM | POA: Diagnosis present

## 2017-04-16 DIAGNOSIS — M6281 Muscle weakness (generalized): Secondary | ICD-10-CM | POA: Diagnosis present

## 2017-04-16 DIAGNOSIS — M436 Torticollis: Secondary | ICD-10-CM | POA: Insufficient documentation

## 2017-04-16 DIAGNOSIS — M25511 Pain in right shoulder: Secondary | ICD-10-CM | POA: Diagnosis present

## 2017-04-16 NOTE — Therapy (Addendum)
[place], [place], [address] Phone: [phone]   Fax:  [phone]  Physical Therapy Treatment/Discharge  Patient Details  Name: Cassandra Gould MRN: [id number] Date of Birth: 02/17/1956 Referring Provider: Domenica Fail PA  Encounter Date: 04/16/2017      PT End of Session - 04/16/17 0939    Visit Number 7   Number of Visits 16   Date for PT Re-Evaluation 05/08/17   PT Start Time 0848   PT Stop Time 0947   PT Time Calculation (min) 59 min   Activity Tolerance Patient tolerated treatment well   Behavior During Therapy [facility] for tasks assessed/performed      Past Medical History:  Diagnosis Date  . Asthma    bronchites  . Bronchitis   . Diabetes mellitus   . Hypertension     Past Surgical History:  Procedure Laterality Date  . LAPAROSCOPIC APPENDECTOMY N/A 08/31/2016   Procedure: APPENDECTOMY LAPAROSCOPIC;  Surgeon: [name], MD;  Location: [facility];  Service: General;  Laterality: N/A;  . TUBAL LIGATION      There were no vitals filed for this visit.      Subjective Assessment - 04/16/17 0852    Subjective Last treatment helped with less pain and less stiffness.  Tape helped some.  burning less frequent.   Patient is accompained by: Family member   Currently in Pain? Yes   Pain Score 3    Pain Location Neck   Pain Orientation Right   Pain Descriptors / Indicators Aching;Burning  Pain moves from side to side at shoulders,  uncomfortable  gets HA 4 X a week, lasting hour to couple of hours.   Pain Type Acute pain   Pain Radiating Towards to deltoid right   Pain Frequency Constant   Aggravating Factors  turning head end range   Pain Relieving Factors PT, rest heat   Effect of Pain on Daily Activities limits reaching and lifting, turning head difficult                         [facility] Adult PT Treatment/Exercise - 04/16/17 0001      Moist Heat Therapy   Number Minutes  Moist Heat 15 Minutes   Moist Heat Location Cervical;Shoulder     Ultrasound   Ultrasound Location RT neck   Ultrasound Parameters 100 % 1.5 watts/cm2   Ultrasound Goals Pain     Manual Therapy   Manual Therapy Taping   Manual therapy comments strumming with gentle stretching : flexion, sidebend and ritation.   Soft tissue mobilization neck right shoulder     Kinesiotix   Inhibit Muscle  upper trap  supraspinatus  posture correction                  PT Short Term Goals - 04/16/17 0855      PT SHORT TERM GOAL #1   Title She will be independent with inital HEP   Period Weeks   Status Achieved     PT SHORT TERM GOAL #2   Title She will report pain decreased 40% or more    Baseline 50-60%   Time 4   Period Weeks   Status Achieved     PT SHORT TERM GOAL #3   Title she will improve cervical rotation to 45 degrees RT/LT   Baseline 40 right, 35 left   Time 4   Period Weeks   Status On-going  PT Long Term Goals - 04/09/17 0925      PT LONG TERM GOAL #1   Title she will be independent with all HEP issued    Status On-going     PT LONG TERM GOAL #2   Title She will return to pre fall level of neck pain   Status On-going     PT LONG TERM GOAL #3   Title She will report pain in RT shoulder decreased 75% or more and be intermittant   Baseline 40-50%   Status On-going     PT LONG TERM GOAL #4   Title she will report return to normal home tasks due to decreased pain   Status On-going               Plan - 04/16/17 0940    Clinical Impression Statement STG#2 met with pain 50-60% improved.  AROM rotation 40 right, left 35 degrees with end range pain. measured prior to treatment.  She should be ready for more exercises next visit as she continues to improve.     PT Treatment/Interventions Cryotherapy;Iontophoresis 55m/ml Dexamethasone;Moist Heat;Ultrasound;Passive range of motion;Patient/family education;Manual techniques;Therapeutic  activities;Therapeutic exercise;Dry needling   PT Next Visit Plan MAnual ,  [place], heat, add to HEP if appropriate.   tape ,  consider rows or other exercises   PT Home Exercise Plan Cervical rotation , posture, thoracic and scapula retraction   Consulted and Agree with Plan of Care Patient      Patient will benefit from skilled therapeutic intervention in order to improve the following deficits and impairments:  Decreased range of motion, Difficulty walking, Pain, Decreased activity tolerance, Postural dysfunction, Decreased strength, Increased muscle spasms, Obesity  Visit Diagnosis: Cervicalgia  Acute pain of right shoulder  Stiffness of neck  Cramp and spasm  Abnormal posture  Muscle weakness (generalized)     Problem List Patient Active Problem List   Diagnosis Date Noted  . Acute perforated appendicitis s/p lap appendectomy 08/31/2016 08/31/2016  . Seasonal allergies 08/31/2016  . Obesity (BMI 30-39.9) 08/31/2016  . Migraines 08/31/2016  . Hypertension   . Poorly controlled type 2 diabetes mellitus ([facility]   . Asthma     HARRIS,KAREN PTA 04/16/2017, 9:43 AM  [facility] [place] [address]Phone: [phone]  Fax:  [phone] Name: Cassandra NucklesMRN: [id number] of Birth: [birth year] PHYSICAL THERAPY DISCHARGE SUMMARY  Visits from Start of Care: 7  Current functional level related to goals / functional outcomes: Unsure as she did not return after this visit   Remaining deficits: UGeneticist, molecular/ Equipment: HEP Plan:                                                    Patient goals were partially met. Patient is being discharged due to not returning since the last visit.  ?????    SNoralee StainPT   06/23/17     7:54 AM

## 2017-04-17 ENCOUNTER — Other Ambulatory Visit (INDEPENDENT_AMBULATORY_CARE_PROVIDER_SITE_OTHER): Payer: Self-pay | Admitting: Physician Assistant

## 2017-04-17 DIAGNOSIS — Z794 Long term (current) use of insulin: Principal | ICD-10-CM

## 2017-04-17 DIAGNOSIS — E119 Type 2 diabetes mellitus without complications: Secondary | ICD-10-CM

## 2017-04-17 MED ORDER — DULAGLUTIDE 0.75 MG/0.5ML ~~LOC~~ SOAJ: 0.5000 mL | SUBCUTANEOUS | 11 refills | Status: DC

## 2017-05-04 ENCOUNTER — Telehealth (INDEPENDENT_AMBULATORY_CARE_PROVIDER_SITE_OTHER): Payer: Self-pay | Admitting: *Deleted

## 2017-05-04 ENCOUNTER — Other Ambulatory Visit (INDEPENDENT_AMBULATORY_CARE_PROVIDER_SITE_OTHER): Payer: Self-pay | Admitting: Physician Assistant

## 2017-05-04 DIAGNOSIS — E1165 Type 2 diabetes mellitus with hyperglycemia: Secondary | ICD-10-CM

## 2017-05-04 MED ORDER — INSULIN DETEMIR 100 UNIT/ML FLEXPEN: 25.0000 [IU] | PEN_INJECTOR | Freq: Every day | SUBCUTANEOUS | 11 refills | Status: DC

## 2017-05-04 NOTE — Telephone Encounter (Signed)
Medical Assistant left message on patient's home and cell voicemail. Voicemail states to give a call back to [place] with [facility] at [phone]. !!!Patient is aware of insulin being changed from Lantus to Levemir with the instructions remaining the same at 25 units at bedtime!!!

## 2017-05-04 NOTE — Telephone Encounter (Signed)
-----   Message from Loletta Specter, PA-C sent at 05/04/2017  1:27 PM EDT ----- Regarding: Insulin Insulin rx has been changed from Lantus to Levemir. Same directions, use 25 units at bedtime. Please notify patient.

## 2017-06-04 ENCOUNTER — Telehealth (INDEPENDENT_AMBULATORY_CARE_PROVIDER_SITE_OTHER): Payer: Self-pay | Admitting: Physician Assistant

## 2017-06-04 ENCOUNTER — Other Ambulatory Visit (INDEPENDENT_AMBULATORY_CARE_PROVIDER_SITE_OTHER): Payer: Self-pay | Admitting: Physician Assistant

## 2017-06-04 DIAGNOSIS — Z760 Encounter for issue of repeat prescription: Secondary | ICD-10-CM

## 2017-06-04 MED ORDER — CETIRIZINE HCL 10 MG PO TABS: 10.0000 mg | ORAL_TABLET | Freq: Every day | ORAL | 2 refills | Status: DC

## 2017-06-04 NOTE — Telephone Encounter (Signed)
Pt called to request a refill for her allergy med cetirizine (ZYRTEC) 10 MG tablet   Since she only see the pcp here, she want to be sent the prescription to the [facility] on [address], please follow up

## 2017-06-04 NOTE — Telephone Encounter (Signed)
Refill sent.

## 2017-06-04 NOTE — Telephone Encounter (Signed)
FWD to PCP. Cassandra Gould, CMA  

## 2017-06-29 ENCOUNTER — Ambulatory Visit (INDEPENDENT_AMBULATORY_CARE_PROVIDER_SITE_OTHER): Payer: BC Managed Care – PPO | Admitting: Physician Assistant

## 2017-06-29 ENCOUNTER — Encounter (INDEPENDENT_AMBULATORY_CARE_PROVIDER_SITE_OTHER): Payer: Self-pay

## 2017-07-19 ENCOUNTER — Other Ambulatory Visit (INDEPENDENT_AMBULATORY_CARE_PROVIDER_SITE_OTHER): Payer: Self-pay | Admitting: Physician Assistant

## 2017-07-19 DIAGNOSIS — E119 Type 2 diabetes mellitus without complications: Secondary | ICD-10-CM

## 2017-07-20 NOTE — Telephone Encounter (Signed)
FWD to PCP. Tianne Plott S Niema Carrara, CMA  

## 2017-09-26 ENCOUNTER — Emergency Department (HOSPITAL_COMMUNITY): Payer: BC Managed Care – PPO

## 2017-09-26 ENCOUNTER — Encounter (HOSPITAL_COMMUNITY): Payer: Self-pay | Admitting: Emergency Medicine

## 2017-09-26 ENCOUNTER — Emergency Department (HOSPITAL_COMMUNITY)
Admission: EM | Admit: 2017-09-26 | Discharge: 2017-09-26 | Disposition: A | Discharge status: Home / Self Care | Payer: BC Managed Care – PPO | Attending: Emergency Medicine | Admitting: Emergency Medicine

## 2017-09-26 ENCOUNTER — Other Ambulatory Visit: Payer: Self-pay

## 2017-09-26 DIAGNOSIS — J45909 Unspecified asthma, uncomplicated: Secondary | ICD-10-CM | POA: Insufficient documentation

## 2017-09-26 DIAGNOSIS — E1169 Type 2 diabetes mellitus with other specified complication: Secondary | ICD-10-CM | POA: Diagnosis not present

## 2017-09-26 DIAGNOSIS — M25532 Pain in left wrist: Secondary | ICD-10-CM | POA: Diagnosis not present

## 2017-09-26 DIAGNOSIS — Z87891 Personal history of nicotine dependence: Secondary | ICD-10-CM | POA: Insufficient documentation

## 2017-09-26 DIAGNOSIS — Z79899 Other long term (current) drug therapy: Secondary | ICD-10-CM | POA: Insufficient documentation

## 2017-09-26 DIAGNOSIS — M79644 Pain in right finger(s): Secondary | ICD-10-CM | POA: Diagnosis not present

## 2017-09-26 DIAGNOSIS — I10 Essential (primary) hypertension: Secondary | ICD-10-CM | POA: Diagnosis not present

## 2017-09-26 DIAGNOSIS — Z794 Long term (current) use of insulin: Secondary | ICD-10-CM | POA: Diagnosis not present

## 2017-09-26 MED ORDER — DICLOFENAC SODIUM 1 % TD GEL: 4.0000 g | Freq: Four times a day (QID) | TRANSDERMAL | 1 refills | Status: DC

## 2017-09-26 NOTE — Discharge Instructions (Signed)
Return if any problems.  See your Physician for recheck in 1 week   °

## 2017-09-26 NOTE — ED Triage Notes (Signed)
Pt. Stated, Ive had left wrist pain and rt. Thumb pain for 3 weeks.. Denies any injury

## 2017-09-26 NOTE — ED Provider Notes (Signed)
[facility] EMERGENCY DEPARTMENT Provider Note   CSN: [id number] Arrival date & time: 09/26/17  0830     History   Chief Complaint Chief Complaint  Patient presents with  . Wrist Pain  . Hand Pain    HPI Cassandra Gould is a 62 y.o. female.  The history is provided by the patient. No language interpreter was used.  Wrist Pain  This is a new problem. Episode onset: 3 weeks. The problem occurs constantly. The problem has been gradually worsening. Pertinent negatives include no headaches. Nothing aggravates the symptoms. Nothing relieves the symptoms. She has tried nothing for the symptoms. The treatment provided no relief.  Hand Pain  Pertinent negatives include no headaches.   Pt complains of pain and weakness in her left wrist and her right thumb.  Pt denies any injuries. Past Medical History:  Diagnosis Date  . Asthma    bronchites  . Bronchitis   . Diabetes mellitus   . Hypertension     Patient Active Problem List   Diagnosis Date Noted  . Acute perforated appendicitis s/p lap appendectomy 08/31/2016 08/31/2016  . Seasonal allergies 08/31/2016  . Obesity (BMI 30-39.9) 08/31/2016  . Migraines 08/31/2016  . Hypertension   . Poorly controlled type 2 diabetes mellitus ([place])   . Asthma     Past Surgical History:  Procedure Laterality Date  . LAPAROSCOPIC APPENDECTOMY N/A 08/31/2016   Procedure: APPENDECTOMY LAPAROSCOPIC;  Surgeon: [name], MD;  Location: [facility];  Service: General;  Laterality: N/A;  . TUBAL LIGATION      OB History    No data available       Home Medications    Prior to Admission medications   Medication Sig Start Date End Date Taking? Authorizing Provider  blood glucose meter kit and supplies KIT Dispense based on patient and insurance preference. Use up to four times daily as directed. (FOR ICD-9 250.00, 250.01). 12/26/16   Clent Demark, PA-C  cetirizine (ZYRTEC) 10 MG tablet Take 1 tablet (10 mg total) by  mouth daily. 06/04/17   Clent Demark, PA-C  Dulaglutide (TRULICITY) 6.81 EX/5.1ZG SOPN Inject 0.5 mLs into the skin every 7 (seven) days. 04/17/17   Clent Demark, PA-C  Exenatide ER (BYDUREON) 2 MG PEN Inject 2 mg into the skin once a week. 03/30/17   Clent Demark, PA-C  gabapentin (NEURONTIN) 300 MG capsule Take 2 capsules (600 mg total) by mouth 3 (three) times daily. 03/02/17   Clent Demark, PA-C  glimepiride (AMARYL) 4 MG tablet TAKE 1 TABLET BY MOUTH ONCE DAILY WITH BREAKFAST 07/20/17   Clent Demark, PA-C  Insulin Detemir (LEVEMIR FLEXTOUCH) 100 UNIT/ML Pen Inject 25 Units into the skin daily at 10 pm. 05/04/17   Clent Demark, PA-C  insulin glargine (LANTUS) 100 unit/mL SOPN Inject 0.45 mLs (45 Units total) into the skin at bedtime. 03/30/17   Clent Demark, PA-C  Insulin Pen Needle (BD ULTRA-FINE MICRO PEN NEEDLE) 32G X 6 MM MISC 1 each by Does not apply route at bedtime. 01/06/17   Clent Demark, PA-C  lisinopril-hydrochlorothiazide (ZESTORETIC) 20-12.5 MG tablet Take 2 tablets by mouth daily. 12/26/16   Clent Demark, PA-C  metFORMIN (GLUCOPHAGE) 1000 MG tablet Take 1 tablet (1,000 mg total) by mouth 2 (two) times daily with a meal. 12/26/16   Clent Demark, PA-C  predniSONE (DELTASONE) 20 MG tablet Take 3 tablets (60 mg total) by mouth daily with breakfast. Patient not  taking: Reported on 03/16/2017 02/23/17   Clent Demark, PA-C  simvastatin (ZOCOR) 40 MG tablet Take 40 mg by mouth every evening.    [provider]    Family History Family History  Problem Relation Age of Onset  . Hypertension Mother   . Diabetes Mother   . Diabetes Sister     Social History Social History   Tobacco Use  . Smoking status: Former Research scientist (life sciences)  . Smokeless tobacco: Never Used  Substance Use Topics  . Alcohol use: No  . Drug use: No     Allergies   Oxycodone   Review of Systems Review of Systems  Neurological: Negative for headaches.    All other systems reviewed and are negative.    Physical Exam Updated Vital Signs BP (!) 158/66 (BP Location: Right Arm)   Pulse (!) 101   Temp 98.6 F (37 C) (Oral)   Resp 17   Ht '5\' 11"'  (1.803 m)   Wt 118.8 kg (262 lb)   SpO2 100%   BMI 36.54 kg/m   Physical Exam  Constitutional: She appears well-developed and well-nourished.  Musculoskeletal: She exhibits tenderness.  Tender right thumb, pain with movement,  Left wrist pain with range of motion,  nv and ns intact  Neurological: She is alert.  Skin: Skin is warm.  Psychiatric: She has a normal mood and affect.     ED Treatments / Results  Labs (all labs ordered are listed, but only abnormal results are displayed) Labs Reviewed - No data to display  EKG  EKG Interpretation None       Radiology Dg Wrist Complete Left  Result Date: 09/26/2017 CLINICAL DATA:  Thumb and wrist pain.  No known injury. EXAM: LEFT WRIST - COMPLETE 3+ VIEW COMPARISON:  None. FINDINGS: Mild degenerative changes at the 1st carpometacarpal joint with spurring and early joint space narrowing. No acute bony abnormality. Specifically, no fracture, subluxation, or dislocation. IMPRESSION: No acute bony abnormality. Electronically Signed   By: Rolm Baptise M.D.   On: 09/26/2017 10:05   Dg Finger Thumb Right  Result Date: 09/26/2017 CLINICAL DATA:  Right thumb pain. EXAM: RIGHT THUMB 2+V COMPARISON:  None. FINDINGS: There is no evidence of fracture or dislocation. No bony lesions or destruction. 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 Soft tissues are unremarkable IMPRESSION: Mild osteoarthritis. Electronically Signed   By: Aletta Edouard M.D.   On: 09/26/2017 10:06    Procedures Procedures (including critical care time)  Medications Ordered in ED Medications - No data to display   Initial Impression / Assessment and Plan / ED Course  I have reviewed the triage vital signs and the nursing notes.  Pertinent  labs & imaging results that were available during my care of the patient were reviewed by me and considered in my medical decision making (see chart for details).    X-rays show osteoarthritis no other abnormalities counseled patient on findings.  We will try treating her with some Voltaren gel she is advised to follow-up with her primary care doctor in 1 week.  Final Clinical Impressions(s) / ED Diagnoses   Final diagnoses:  Left wrist pain  Thumb pain, right    ED Discharge Orders        Ordered    diclofenac sodium (VOLTAREN) 1 % GEL  4 times daily     09/26/17 1022    An After Visit Summary was printed and given to the patient.   Fransico Meadow, [place]  09/26/17 [place], MD 09/26/17 1136

## 2017-10-11 ENCOUNTER — Other Ambulatory Visit (INDEPENDENT_AMBULATORY_CARE_PROVIDER_SITE_OTHER): Payer: Self-pay | Admitting: Physician Assistant

## 2017-11-17 NOTE — Telephone Encounter (Signed)
FWD to PCP. Waylyn Tenbrink S Galadriel Shroff, CMA  

## 2017-12-02 ENCOUNTER — Encounter (HOSPITAL_COMMUNITY): Payer: Self-pay | Admitting: Emergency Medicine

## 2017-12-02 ENCOUNTER — Emergency Department (HOSPITAL_COMMUNITY)
Admission: EM | Admit: 2017-12-02 | Discharge: 2017-12-02 | Disposition: A | Discharge status: Home / Self Care | Payer: BC Managed Care – PPO | Attending: Emergency Medicine | Admitting: Emergency Medicine

## 2017-12-02 ENCOUNTER — Emergency Department (HOSPITAL_COMMUNITY): Payer: BC Managed Care – PPO

## 2017-12-02 DIAGNOSIS — Z794 Long term (current) use of insulin: Secondary | ICD-10-CM | POA: Diagnosis not present

## 2017-12-02 DIAGNOSIS — W1830XA Fall on same level, unspecified, initial encounter: Secondary | ICD-10-CM | POA: Diagnosis not present

## 2017-12-02 DIAGNOSIS — I10 Essential (primary) hypertension: Secondary | ICD-10-CM | POA: Diagnosis not present

## 2017-12-02 DIAGNOSIS — Y929 Unspecified place or not applicable: Secondary | ICD-10-CM | POA: Diagnosis not present

## 2017-12-02 DIAGNOSIS — Z79899 Other long term (current) drug therapy: Secondary | ICD-10-CM | POA: Diagnosis not present

## 2017-12-02 DIAGNOSIS — Y939 Activity, unspecified: Secondary | ICD-10-CM | POA: Insufficient documentation

## 2017-12-02 DIAGNOSIS — W19XXXA Unspecified fall, initial encounter: Secondary | ICD-10-CM

## 2017-12-02 DIAGNOSIS — S62112A Displaced fracture of triquetrum [cuneiform] bone, left wrist, initial encounter for closed fracture: Secondary | ICD-10-CM | POA: Insufficient documentation

## 2017-12-02 DIAGNOSIS — Y999 Unspecified external cause status: Secondary | ICD-10-CM | POA: Diagnosis not present

## 2017-12-02 DIAGNOSIS — Z87891 Personal history of nicotine dependence: Secondary | ICD-10-CM | POA: Insufficient documentation

## 2017-12-02 DIAGNOSIS — E119 Type 2 diabetes mellitus without complications: Secondary | ICD-10-CM | POA: Diagnosis not present

## 2017-12-02 DIAGNOSIS — S6992XA Unspecified injury of left wrist, hand and finger(s), initial encounter: Secondary | ICD-10-CM | POA: Diagnosis present

## 2017-12-02 MED ORDER — HYDROCODONE-ACETAMINOPHEN 5-325 MG PO TABS: 1.0000 | ORAL_TABLET | Freq: Four times a day (QID) | ORAL | 0 refills | Status: DC | PRN

## 2017-12-02 MED ORDER — ACETAMINOPHEN 500 MG PO TABS
1000.0000 mg | ORAL_TABLET | Freq: Once | ORAL | Status: AC
  Administered 2017-12-02: 1000 mg via ORAL
  Filled 2017-12-02: qty 2

## 2017-12-02 NOTE — ED Provider Notes (Signed)
[facility] DEPT Provider Note   CSN: [id number] Arrival date & time: 12/02/17  1311     History   Chief Complaint Chief Complaint  Patient presents with  . Fall  . Hand Pain  . Arm Pain    HPI Cassandra Gould is a 62 y.o. female.  Cassandra Gould is a 62 y.o. Female history of diabetes, hypertension and asthma, who presents to the ED for evaluation after she tripped and fell today.  Patient reports she was helping a friend who is being transported to the emergency department, she was locking up her house and tripped fell landing on her left arm.  Patient reports since then she has had pain throughout the left arm, pain is worst at the wrist and patient reports some mild swelling, she reports mainly soreness in the forearm, elbow upper arm and shoulder, but she is able to move these without difficulty.  Patient denies any numbness or tingling.  No previous injury to this arm.  Patient reports she did not hit her head no loss of consciousness, denies any pain in the neck or back, no chest pain, shortness of breath or abdominal pain.  No lacerations, abrasions or bruises.       Past Medical History:  Diagnosis Date  . Asthma    bronchites  . Bronchitis   . Diabetes mellitus   . Hypertension     Patient Active Problem List   Diagnosis Date Noted  . Acute perforated appendicitis s/p lap appendectomy 08/31/2016 08/31/2016  . Seasonal allergies 08/31/2016  . Obesity (BMI 30-39.9) 08/31/2016  . Migraines 08/31/2016  . Hypertension   . Poorly controlled type 2 diabetes mellitus ([place])   . Asthma     Past Surgical History:  Procedure Laterality Date  . LAPAROSCOPIC APPENDECTOMY N/A 08/31/2016   Procedure: APPENDECTOMY LAPAROSCOPIC;  Surgeon: [name], MD;  Location: [facility]S;  Service: General;  Laterality: N/A;  . TUBAL LIGATION      OB History    No data available       Home Medications    Prior to Admission medications     Medication Sig Start Date End Date Taking? Authorizing Provider  blood glucose meter kit and supplies KIT Dispense based on patient and insurance preference. Use up to four times daily as directed. (FOR ICD-9 250.00, 250.01). 12/26/16   Clent Demark, PA-C  cetirizine (ZYRTEC) 10 MG tablet TAKE 1 TABLET BY MOUTH ONCE DAILY 11/20/17   Clent Demark, PA-C  diclofenac sodium (VOLTAREN) 1 % GEL Apply 4 g topically 4 (four) times daily. 09/26/17   Fransico Meadow, PA-C  Dulaglutide (TRULICITY) 7.32 KG/2.5KY SOPN Inject 0.5 mLs into the skin every 7 (seven) days. 04/17/17   Clent Demark, PA-C  Exenatide ER (BYDUREON) 2 MG PEN Inject 2 mg into the skin once a week. 03/30/17   Clent Demark, PA-C  gabapentin (NEURONTIN) 300 MG capsule Take 2 capsules (600 mg total) by mouth 3 (three) times daily. 03/02/17   Clent Demark, PA-C  glimepiride (AMARYL) 4 MG tablet TAKE 1 TABLET BY MOUTH ONCE DAILY WITH BREAKFAST 07/20/17   Clent Demark, PA-C  Insulin Detemir (LEVEMIR FLEXTOUCH) 100 UNIT/ML Pen Inject 25 Units into the skin daily at 10 pm. 05/04/17   Clent Demark, PA-C  insulin glargine (LANTUS) 100 unit/mL SOPN Inject 0.45 mLs (45 Units total) into the skin at bedtime. 03/30/17   Clent Demark, PA-C  Insulin Pen Needle (BD  ULTRA-FINE MICRO PEN NEEDLE) 32G X 6 MM MISC 1 each by Does not apply route at bedtime. 01/06/17   Clent Demark, PA-C  lisinopril-hydrochlorothiazide (ZESTORETIC) 20-12.5 MG tablet Take 2 tablets by mouth daily. 12/26/16   Clent Demark, PA-C  metFORMIN (GLUCOPHAGE) 1000 MG tablet Take 1 tablet (1,000 mg total) by mouth 2 (two) times daily with a meal. 12/26/16   Clent Demark, PA-C  predniSONE (DELTASONE) 20 MG tablet Take 3 tablets (60 mg total) by mouth daily with breakfast. Patient not taking: Reported on 03/16/2017 02/23/17   Clent Demark, PA-C  simvastatin (ZOCOR) 40 MG tablet Take 40 mg by mouth every evening.    [provider]     Family History Family History  Problem Relation Age of Onset  . Hypertension Mother   . Diabetes Mother   . Diabetes Sister     Social History Social History   Tobacco Use  . Smoking status: Former Research scientist (life sciences)  . Smokeless tobacco: Never Used  Substance Use Topics  . Alcohol use: No  . Drug use: No     Allergies   Oxycodone   Review of Systems Review of Systems  Constitutional: Negative for chills and fever.  Eyes: Negative for visual disturbance.  Respiratory: Negative for shortness of breath.   Cardiovascular: Negative for chest pain.  Gastrointestinal: Negative for abdominal pain.  Musculoskeletal: Positive for arthralgias, joint swelling and myalgias. Negative for neck pain and neck stiffness.  Skin: Negative for color change, rash and wound.  Neurological: Negative for weakness, numbness and headaches.     Physical Exam Updated Vital Signs BP (!) 164/94 (BP Location: Left Arm)   Pulse 93   Temp 98 F (36.7 C) (Oral)   Resp 16   Ht _0  (1.803 m)   Wt 118.4 kg (261 lb)   SpO2 100%   BMI 36.40 kg/m   Physical Exam  Constitutional: She appears well-developed and well-nourished. No distress.  HENT:  Head: Normocephalic and atraumatic.  Eyes: Right eye exhibits no discharge. Left eye exhibits no discharge.  Neck: Normal range of motion. Neck supple.  C-spine nontender to palpation at midline, normal range of motion in all directions without pain  Cardiovascular: Normal rate, regular rhythm and normal heart sounds.  Pulmonary/Chest: Effort normal and breath sounds normal. No stridor. No respiratory distress. She has no wheezes. She has no rales.  Chest nontender to palpation  Abdominal: Soft. Bowel sounds are normal. She exhibits no distension. There is no tenderness.  Musculoskeletal:  Tenderness and swelling over the dorsal aspect of the wrist, range of motion limited by pain, patient able to almost make complete fist thumbs up, okay sign and expand  fingers, 2+ radial and ulnar pulses with good capillary refill and sensation intact throughout the hand. Mild tenderness throughout the forearm and upper arm, normal range of motion of the elbow and shoulder with minimal discomfort, no palpable deformity of the shoulder or arm.  Neurological: She is alert. Coordination normal.  Skin: Skin is warm and dry. Capillary refill takes less than 2 seconds. She is not diaphoretic.  Psychiatric: She has a normal mood and affect. Her behavior is normal.  Nursing note and vitals reviewed.    ED Treatments / Results  Labs (all labs ordered are listed, but only abnormal results are displayed) Labs Reviewed - No data to display  EKG  EKG Interpretation None       Radiology Dg Forearm Left  Result Date: 12/02/2017 CLINICAL  DATA:  Fall EXAM: LEFT FOREARM - 2 VIEW COMPARISON:  None. FINDINGS: There is no evidence of fracture or other focal bone lesions involving the radius or ulna. Dorsal wrist fracture better characterized on the dedicated wrist radiograph. Soft tissues are unremarkable. IMPRESSION: No fracture or dislocation of the left radius or ulna. Electronically Signed   By: Ulyses Jarred M.D.   On: 12/02/2017 14:12   Dg Wrist Complete Left  Result Date: 12/02/2017 CLINICAL DATA:  Fall with wrist pain EXAM: LEFT WRIST - COMPLETE 3+ VIEW COMPARISON:  Left wrist radiograph 09/26/2017 FINDINGS: Minimally displaced fracture noted at the dorsal wrist, new compared to the prior wrist radiograph. This is likely arising from the triquetrum. Moderate soft tissue swelling. IMPRESSION: Minimally displaced dorsal wrist fracture, likely the triquetrum. Electronically Signed   By: Ulyses Jarred M.D.   On: 12/02/2017 14:11   Dg Shoulder Left  Result Date: 12/02/2017 CLINICAL DATA:  Fall with shoulder and clavicle pain. EXAM: LEFT SHOULDER - 2+ VIEW COMPARISON:  None. FINDINGS: No fracture or dislocation at the left shoulder. The medial portion of the clavicle  is incompletely visualized on this study. Glenohumeral joint and acromioclavicular joint are approximated. IMPRESSION: 1. No left shoulder fracture or dislocation. 2. Incomplete visualization of the medial aspect of the left clavicle. In the context of medial clavicular pain, dedicated clavicle radiographs are recommended. Electronically Signed   By: Ulyses Jarred M.D.   On: 12/02/2017 14:13   Dg Hand Complete Left  Result Date: 12/02/2017 CLINICAL DATA:  Fall with wrist pain EXAM: LEFT HAND - COMPLETE 3+ VIEW COMPARISON:  Left wrist radiograph 09/26/2017 FINDINGS: There is a minimally displaced fracture at the dorsal aspect of the left wrist, likely a triquetrum fracture. There is moderate soft tissue swelling. No other fracture of the left hand. The bones are osteopenic. Joint spaces are maintained. IMPRESSION: Minimally displaced dorsal wrist fracture, likely the triquetrum. Electronically Signed   By: Ulyses Jarred M.D.   On: 12/02/2017 14:10    Procedures Procedures (including critical care time)  Medications Ordered in ED Medications  acetaminophen (TYLENOL) tablet 1,000 mg (1,000 mg Oral Given 12/02/17 1526)     Initial Impression / Assessment and Plan / ED Course  I have reviewed the triage vital signs and the nursing notes.  Pertinent labs & imaging results that were available during my care of the patient were reviewed by me and considered in my medical decision making (see chart for details).  Presents for evaluation after she tripped and fell landing on her left arm, pain primarily over the left wrist with muscle soreness and pain in the upper arm and shoulder.  Left upper extremity is neurovascularly intact.  X-ray shows mildly displaced fracture of the triquetrum, no other fractures or bony abnormalities noted of the wrist, forearm, elbow, humerus or shoulder.  Patient did not hit her head, no loss of consciousness, no midline spinal tenderness, or tenderness of the chest or  abdomen.  Patient placed in volar wrist splint, Tylenol for pain here in the ED as she would like to drive herself home, patient to follow-up in the office with Dr. Amedeo Plenty.  Provided short course of Norco for pain.  Encourage patient to use ice and elevation.  Return precautions discussed.  Patient expresses understanding and is in agreement with plan.  Final Clinical Impressions(s) / ED Diagnoses   Final diagnoses:  Fall, initial encounter  Closed displaced fracture of triquetrum of left wrist, initial encounter    ED  Discharge Orders        Ordered    HYDROcodone-acetaminophen (NORCO) 5-325 MG tablet  Every 6 hours PRN     12/02/17 1525       Jacqlyn Larsen, [place] 12/02/17 1614    Tanna Furry, MD 12/03/17 0830

## 2017-12-02 NOTE — ED Triage Notes (Signed)
Pt reports fell today and has pain in left hand and wrist up to shoulder. Pt has pain when moving pains.

## 2017-12-02 NOTE — Discharge Instructions (Signed)
Please remain in splint until you are seen by Dr. Amanda PeaGramig, keep splint clean and dry, elevate and apply ice to arm, you may use Norco for pain, this medication can cause drowsiness, do not take before driving.  If you have severely worsening pain, numbness in your hand or fingers, discoloration of the hand or any other new or concerning symptoms please return to the emergency department for reevaluation.

## 2018-01-13 ENCOUNTER — Other Ambulatory Visit (INDEPENDENT_AMBULATORY_CARE_PROVIDER_SITE_OTHER): Payer: Self-pay | Admitting: Physician Assistant

## 2018-01-13 DIAGNOSIS — E119 Type 2 diabetes mellitus without complications: Secondary | ICD-10-CM

## 2018-01-13 NOTE — Telephone Encounter (Signed)
FWD to PCP. Tempestt S Roberts, CMA  

## 2018-02-16 ENCOUNTER — Other Ambulatory Visit (INDEPENDENT_AMBULATORY_CARE_PROVIDER_SITE_OTHER): Payer: Self-pay | Admitting: Physician Assistant

## 2018-02-16 DIAGNOSIS — E119 Type 2 diabetes mellitus without complications: Secondary | ICD-10-CM

## 2018-03-19 ENCOUNTER — Other Ambulatory Visit (INDEPENDENT_AMBULATORY_CARE_PROVIDER_SITE_OTHER): Payer: Self-pay | Admitting: Physician Assistant

## 2018-03-19 DIAGNOSIS — E119 Type 2 diabetes mellitus without complications: Secondary | ICD-10-CM

## 2018-03-29 ENCOUNTER — Other Ambulatory Visit (INDEPENDENT_AMBULATORY_CARE_PROVIDER_SITE_OTHER): Payer: Self-pay | Admitting: Physician Assistant

## 2018-03-29 DIAGNOSIS — E119 Type 2 diabetes mellitus without complications: Secondary | ICD-10-CM

## 2018-03-29 NOTE — Telephone Encounter (Signed)
FWD to PCP. Satonya Lux S Tyra Michelle, CMA  

## 2018-07-27 ENCOUNTER — Other Ambulatory Visit: Payer: Self-pay

## 2018-07-27 ENCOUNTER — Ambulatory Visit (INDEPENDENT_AMBULATORY_CARE_PROVIDER_SITE_OTHER): Payer: BC Managed Care – PPO | Admitting: Physician Assistant

## 2018-07-27 ENCOUNTER — Encounter (INDEPENDENT_AMBULATORY_CARE_PROVIDER_SITE_OTHER): Payer: Self-pay | Admitting: Physician Assistant

## 2018-07-27 VITALS — BP 157/92 | HR 60 | Temp 97.8°F | Ht 71.0 in | Wt 250.0 lb

## 2018-07-27 DIAGNOSIS — Z794 Long term (current) use of insulin: Secondary | ICD-10-CM

## 2018-07-27 DIAGNOSIS — L739 Follicular disorder, unspecified: Secondary | ICD-10-CM

## 2018-07-27 DIAGNOSIS — Z91199 Patient's noncompliance with other medical treatment and regimen due to unspecified reason: Secondary | ICD-10-CM

## 2018-07-27 DIAGNOSIS — Z23 Encounter for immunization: Secondary | ICD-10-CM | POA: Diagnosis not present

## 2018-07-27 DIAGNOSIS — E1142 Type 2 diabetes mellitus with diabetic polyneuropathy: Secondary | ICD-10-CM

## 2018-07-27 DIAGNOSIS — Z9119 Patient's noncompliance with other medical treatment and regimen: Secondary | ICD-10-CM | POA: Diagnosis not present

## 2018-07-27 DIAGNOSIS — I10 Essential (primary) hypertension: Secondary | ICD-10-CM | POA: Diagnosis not present

## 2018-07-27 LAB — POCT GLYCOSYLATED HEMOGLOBIN (HGB A1C): Hemoglobin A1C: 9.9 % — AB (ref 4.0–5.6)

## 2018-07-27 MED ORDER — GABAPENTIN 300 MG PO CAPS: 600.0000 mg | ORAL_CAPSULE | Freq: Three times a day (TID) | ORAL | 3 refills | Status: AC

## 2018-07-27 MED ORDER — LISINOPRIL-HYDROCHLOROTHIAZIDE 20-12.5 MG PO TABS: 2.0000 | ORAL_TABLET | Freq: Every day | ORAL | 1 refills | Status: DC

## 2018-07-27 MED ORDER — CLONIDINE HCL 0.1 MG PO TABS
0.1000 mg | ORAL_TABLET | Freq: Once | ORAL | Status: AC
  Administered 2018-07-27: 0.1 mg via ORAL

## 2018-07-27 MED ORDER — ATORVASTATIN CALCIUM 40 MG PO TABS: 40.0000 mg | ORAL_TABLET | Freq: Every day | ORAL | 3 refills | Status: AC

## 2018-07-27 MED ORDER — INSULIN PEN NEEDLE 32G X 6 MM MISC: 1.0000 | Freq: Every day | 3 refills | Status: AC

## 2018-07-27 MED ORDER — BACITRACIN-NEOMYCIN-POLYMYXIN 400-5-5000 EX OINT: 1.0000 "application " | TOPICAL_OINTMENT | Freq: Two times a day (BID) | CUTANEOUS | 0 refills | Status: AC

## 2018-07-27 MED ORDER — INSULIN DETEMIR 100 UNIT/ML FLEXPEN: 35.0000 [IU] | PEN_INJECTOR | Freq: Every day | SUBCUTANEOUS | 5 refills | Status: AC

## 2018-07-27 MED ORDER — METFORMIN HCL 1000 MG PO TABS: ORAL_TABLET | ORAL | 5 refills | Status: AC

## 2018-07-27 MED ORDER — SITAGLIPTIN PHOSPHATE 100 MG PO TABS: 100.0000 mg | ORAL_TABLET | Freq: Every day | ORAL | 5 refills | Status: AC

## 2018-07-27 NOTE — Progress Notes (Signed)
Subjective:  Patient ID: Cassandra Gould, female    DOB: 05/22/1956  Age: 62 y.o. MRN: [id number]  CC: diabetes  HPI Cassandra GillisShirley Woody is a 62 y.o. female with a PMH of asthma, bronchitis, DM2, and HTN presents to f/u on DM2. Last A1c 10.8% nearly 16 months ago. Lantus was increased from 25 units to 45 units qhs. Prescribed Exenatide ER 2 mg qweek. Advised to continue on Metformin and glimepiride 4 mg. Pt failed to return for her 3 month follow up. She has since been prescribed different anti-glycemics due to insurance coverage. Does not recognize some of her medications and not sure she ever took medication, eg. Levemir and Trulicity. A1c 9.9% today in clinic. Not taking insulin or GLP1 after she finished original prescription because she could not afford prescriptions any longer. Has been drinking herbalife tea to help curb appetite. Eats fruits and crackers for snacks. Main meals consist of salads, vegetables, and some meat. She has been losing weight with her Herbalife diet. Lost approximately 40 lbs. Does not exercise regularly due to MSK issues.     Complains of occasional "bumps" on scalp that seem to bleed and suppurate pus. Has not applied or taken anything for relief.      ROS Review of Systems  Constitutional: Negative for chills, fever and malaise/fatigue.  Eyes: Negative for blurred vision.  Respiratory: Negative for shortness of breath.   Cardiovascular: Negative for chest pain and palpitations.  Gastrointestinal: Negative for abdominal pain and nausea.  Genitourinary: Negative for dysuria and hematuria.  Musculoskeletal: Negative for joint pain and myalgias.  Skin: Negative for rash.  Neurological: Negative for tingling and headaches.  Psychiatric/Behavioral: Negative for depression. The patient is not nervous/anxious.     Objective:  BP (!) 157/92 (BP Location: Right Arm, Patient Position: Sitting, Cuff Size: Large)   Pulse 60   Temp 97.8 F (36.6 C) (Oral)   Ht 5\' 11"   (1.803 m)   Wt 250 lb (113.4 kg)   BMI 34.87 kg/m   BP/Weight 07/27/2018 12/02/2017 09/26/2017  Systolic BP 157 172 158  Diastolic BP 92 77 66  Wt. (Lbs) 250 261 262  BMI 34.87 36.4 36.54      Physical Exam  Constitutional: She is oriented to person, place, and time.  Well developed, obese, NAD, polite  HENT:  Head: Normocephalic and atraumatic.  Eyes: No scleral icterus.  Neck: Normal range of motion. Neck supple. No thyromegaly present.  Cardiovascular: Normal rate, regular rhythm and normal heart sounds.  Pulmonary/Chest: Effort normal and breath sounds normal.  Musculoskeletal: She exhibits no edema.  Neurological: She is alert and oriented to person, place, and time.  Skin: Skin is warm and dry. No rash noted. No erythema. No pallor.  Small, nonerythematous nodule on scalp in proximity to the left side of forehead  Psychiatric: She has a normal mood and affect. Her behavior is normal. Thought content normal.  Vitals reviewed.    Assessment & Plan:   1. Type 2 diabetes mellitus with diabetic polyneuropathy, with long-term current use of insulin (HCC) - HgB A1c 9.9% - metFORMIN (GLUCOPHAGE) 1000 MG tablet; TAKE 1 TABLET BY MOUTH TWICE DAILY WITH MEALS  Dispense: 60 tablet; Refill: 5 - Insulin Pen Needle (BD PEN NEEDLE MICRO U/F) 32G X 6 MM MISC; 1 each by Does not apply route at bedtime.  Dispense: 100 each; Refill: 3 - Insulin Detemir (LEVEMIR FLEXTOUCH) 100 UNIT/ML Pen; Inject 35 Units into the skin daily at 10 pm.  Dispense: 12  mL; Refill: 5 - gabapentin (NEURONTIN) 300 MG capsule; Take 2 capsules (600 mg total) by mouth 3 (three) times daily.  Dispense: 180 capsule; Refill: 3 - Comprehensive metabolic panel - Lipid panel; Future - sitaGLIPtin (JANUVIA) 100 MG tablet; Take 1 tablet (100 mg total) by mouth daily.  Dispense: 30 tablet; Refill: 5 - atorvastatin (LIPITOR) 40 MG tablet; Take 1 tablet (40 mg total) by mouth daily.  Dispense: 90 tablet; Refill: 3  2.  Hypertension, unspecified type - cloNIDine (CATAPRES) tablet 0.1 mg - lisinopril-hydrochlorothiazide (ZESTORETIC) 20-12.5 MG tablet; Take 2 tablets by mouth daily.  Dispense: 180 tablet; Refill: 1 - Comprehensive metabolic panel - Lipid panel; Future  3. Folliculitis - neomycin-bacitracin-polymyxin (NEOSPORIN) ointment; Apply 1 application topically every 12 (twelve) hours.  Dispense: 15 g; Refill: 0  4. Need for prophylactic vaccination and inoculation against influenza - Flu Vaccine QUAD 6+ mos PF IM (Fluarix Quad PF)  5. Noncompliance - Pt counseled on importance of taking medications as prescribed and on keeping follow up appointments.    Meds ordered this encounter  Medications  . cloNIDine (CATAPRES) tablet 0.1 mg  . neomycin-bacitracin-polymyxin (NEOSPORIN) ointment    Sig: Apply 1 application topically every 12 (twelve) hours.    Dispense:  15 g    Refill:  0    Order Specific Question:   Supervising Provider    Answer:   Hoy Register [[id number]]  . metFORMIN (GLUCOPHAGE) 1000 MG tablet    Sig: TAKE 1 TABLET BY MOUTH TWICE DAILY WITH MEALS    Dispense:  60 tablet    Refill:  5    Please consider 90 day supplies to promote better adherence    Order Specific Question:   Supervising Provider    Answer:   Hoy Register [[id number]]  . lisinopril-hydrochlorothiazide (ZESTORETIC) 20-12.5 MG tablet    Sig: Take 2 tablets by mouth daily.    Dispense:  180 tablet    Refill:  1    Order Specific Question:   Supervising Provider    Answer:   Hoy Register [[id number]]  . Insulin Pen Needle (BD PEN NEEDLE MICRO U/F) 32G X 6 MM MISC    Sig: 1 each by Does not apply route at bedtime.    Dispense:  100 each    Refill:  3    Order Specific Question:   Supervising Provider    Answer:   Hoy Register [[id number]]  . Insulin Detemir (LEVEMIR FLEXTOUCH) 100 UNIT/ML Pen    Sig: Inject 35 Units into the skin daily at 10 pm.    Dispense:  12 mL    Refill:  5    Order Specific Question:    Supervising Provider    Answer:   Hoy Register [[id number]]  . gabapentin (NEURONTIN) 300 MG capsule    Sig: Take 2 capsules (600 mg total) by mouth 3 (three) times daily.    Dispense:  180 capsule    Refill:  3    Order Specific Question:   Supervising Provider    Answer:   Hoy Register [[id number]]  . sitaGLIPtin (JANUVIA) 100 MG tablet    Sig: Take 1 tablet (100 mg total) by mouth daily.    Dispense:  30 tablet    Refill:  5    Order Specific Question:   Supervising Provider    Answer:   Hoy Register [[id number]]  . atorvastatin (LIPITOR) 40 MG tablet    Sig: Take 1 tablet (40 mg total) by mouth daily.  Dispense:  90 tablet    Refill:  3    Order Specific Question:   Supervising Provider    Answer:   Hoy Register [[id number]]    Follow-up: Return in about 4 weeks (around 08/24/2018) for bring glucose log for review. HTN.   Loletta Specter PA

## 2018-07-27 NOTE — Progress Notes (Deleted)
Pt complains of pus filled bumps on her head   Pain in right shoulder and neck  Headaches

## 2018-07-27 NOTE — Patient Instructions (Signed)

## 2018-07-27 NOTE — Progress Notes (Deleted)
Subjective:  Patient ID: Cassandra Gould, female    DOB: [birth year]  Age: 62 y.o. MRN: [id number]  CC: bumps on head, DM2  HPI  Cassandra Gould is a 62 y.o. female with a PMH of asthma, bronchitis, DM2, and HTN presents to f/u on DM2. Last A1c 10.8% nearly 16 months ago. Lantus was increased from 25 units to 45 units qhs. Prescribed Exenatide ER 2 mg qweek. Advised to continue on Metformin and glimepiride 4 mg. Pt failed to return for her 3 month follow up. She has since been prescribed different anti-glycemics due to insurance coverage. Does not recognize some of her medications and not sure she ever took medication, eg. Levemir and Trulicity. A1c 9.9% today in clinic. Not taking insulin or GLP1 after she finished original prescription because she could not afford prescriptions any longer. Has been drinking herbalife tea to help curb appetite. Eats fruits and crackers for snacks. Main meals consist of salads, vegetables, and some meat. She has been losing weight with her Herbalife diet. Lost approximately 40 lbs. Does not exercise regularly due to MSK issues.     Complains of occasional "bumps" on scalp that seem to bleed and suppurate pus. Has not applied or taken anything for relief.      Outpatient Medications Prior to Visit  Medication Sig Dispense Refill  . gabapentin (NEURONTIN) 300 MG capsule Take 2 capsules (600 mg total) by mouth 3 (three) times daily. 180 capsule 3  . glimepiride (AMARYL) 4 MG tablet TAKE 1 TABLET BY MOUTH ONCE DAILY WITH BREAKFAST 90 tablet 1  . Insulin Pen Needle (BD ULTRA-FINE MICRO PEN NEEDLE) 32G X 6 MM MISC 1 each by Does not apply route at bedtime. 90 each 1  . metFORMIN (GLUCOPHAGE) 1000 MG tablet TAKE 1 TABLET BY MOUTH TWICE DAILY WITH MEALS *** PT NEEDS TO RETURN TO OFFICE BEFORE FURTHER REFILLS ARE AUTHORIZED*** 60 tablet 5  . simvastatin (ZOCOR) 40 MG tablet Take 40 mg by mouth every evening.    . blood glucose meter kit and supplies KIT Dispense based on  patient and insurance preference. Use up to four times daily as directed. (FOR ICD-9 250.00, 250.01). 1 each 0  . cetirizine (ZYRTEC) 10 MG tablet TAKE 1 TABLET BY MOUTH ONCE DAILY (Patient not taking: Reported on 07/27/2018) 30 tablet 2  . diclofenac sodium (VOLTAREN) 1 % GEL Apply 4 g topically 4 (four) times daily. (Patient not taking: Reported on 07/27/2018) 1 Tube 1  . Dulaglutide (TRULICITY) 9.79 GX/2.1JH SOPN Inject 0.5 mLs into the skin every 7 (seven) days. (Patient not taking: Reported on 07/27/2018) 4 pen 11  . Exenatide ER (BYDUREON) 2 MG PEN Inject 2 mg into the skin once a week. 4 each 5  . Insulin Detemir (LEVEMIR FLEXTOUCH) 100 UNIT/ML Pen Inject 25 Units into the skin daily at 10 pm. (Patient not taking: Reported on 07/27/2018) 12 mL 11  . insulin glargine (LANTUS) 100 unit/mL SOPN Inject 0.45 mLs (45 Units total) into the skin at bedtime. (Patient not taking: Reported on 07/27/2018) 15 mL 5  . lisinopril-hydrochlorothiazide (ZESTORETIC) 20-12.5 MG tablet Take 2 tablets by mouth daily. (Patient not taking: Reported on 07/27/2018) 180 tablet 1  . HYDROcodone-acetaminophen (NORCO) 5-325 MG tablet Take 1 tablet by mouth every 6 (six) hours as needed. 10 tablet 0  . predniSONE (DELTASONE) 20 MG tablet Take 3 tablets (60 mg total) by mouth daily with breakfast. (Patient not taking: Reported on 03/16/2017) 21 tablet 0   No facility-administered medications prior  to visit.      ROS Review of Systems  Constitutional: Negative for chills, fever and malaise/fatigue.  Eyes: Negative for blurred vision.  Respiratory: Negative for shortness of breath.   Cardiovascular: Negative for chest pain and palpitations.  Gastrointestinal: Negative for abdominal pain and nausea.  Genitourinary: Negative for dysuria and hematuria.  Musculoskeletal: Positive for back pain and joint pain. Negative for myalgias.  Skin: Negative for rash.  Neurological: Negative for tingling and headaches.   Psychiatric/Behavioral: Negative for depression. The patient is not nervous/anxious.     Objective:  BP (!) 174/107 (BP Location: Right Arm, Patient Position: Sitting, Cuff Size: Large)   Temp 97.8 F (36.6 C) (Oral)   Ht _0  (1.803 m)   Wt 250 lb (113.4 kg)   BMI 34.87 kg/m   Vitals:   07/27/18 1015 07/27/18 1027  BP: (!) 174/107 (!) 179/94  Pulse: 78   Temp: 97.8 F (36.6 C)   TempSrc: Oral   Weight: 250 lb (113.4 kg)   Height: _1  (1.803 m)      Physical Exam  Constitutional: She is oriented to person, place, and time.  Well developed, obese, NAD, polite  HENT:  Head: Normocephalic and atraumatic.  Eyes: No scleral icterus.  Neck: Normal range of motion. Neck supple. No thyromegaly present.  Cardiovascular: Normal rate, regular rhythm and normal heart sounds.  Pulmonary/Chest: Effort normal and breath sounds normal.  Musculoskeletal: She exhibits no edema.  Neurological: She is alert and oriented to person, place, and time.  Skin: Skin is warm and dry. No rash noted. No erythema. No pallor.  Psychiatric: She has a normal mood and affect. Her behavior is normal. Thought content normal.  Vitals reviewed.    Assessment & Plan:    1. Type 2 diabetes mellitus with diabetic polyneuropathy, with long-term current use of insulin (HCC) - HgB A1c 9.9% today. Likely decreased due to loss of weight. Has not been using insulin.  - Refill metFORMIN (GLUCOPHAGE) 1000 MG tablet; TAKE 1 TABLET BY MOUTH TWICE DAILY WITH MEALS  Dispense: 60 tablet; Refill: 5 - Refill Insulin Pen Needle (BD PEN NEEDLE MICRO U/F) 32G X 6 MM MISC; 1 each by Does not apply route at bedtime.  Dispense: 100 each; Refill: 3 - Refill Insulin Detemir (LEVEMIR FLEXTOUCH) 100 UNIT/ML Pen; Inject 35 Units into the skin daily at 10 pm.  Dispense: 12 mL; Refill: 5 - Refill gabapentin (NEURONTIN) 300 MG capsule; Take 2 capsules (600 mg total) by mouth 3 (three) times daily.  Dispense: 180 capsule; Refill:  3 - Refill sitaGLIPtin (JANUVIA) 100 MG tablet; Take 1 tablet (100 mg total) by mouth daily.  Dispense: 30 tablet; Refill: 5 - Begin atorvastatin (LIPITOR) 40 MG tablet; Take 1 tablet (40 mg total) by mouth daily.  Dispense: 90 tablet; Refill: 3 - Comprehensive metabolic panel - Lipid panel; Future   2. Hypertension, unspecified type - Administered cloNIDine (CATAPRES) tablet 0.1 mg - Refill lisinopril-hydrochlorothiazide (ZESTORETIC) 20-12.5 MG tablet; Take 2 tablets by mouth daily.  Dispense: 180 tablet; Refill: 1 - Comprehensive metabolic panel - Lipid panel; Future  3. Folliculitis - Begin neomycin-bacitracin-polymyxin (NEOSPORIN) ointment; Apply 1 application topically every 12 (twelve) hours.  Dispense: 15 g; Refill: 0  4. Need for prophylactic vaccination and inoculation against influenza - Flu Vaccine QUAD 6+ mos PF IM (Fluarix Quad PF)  5. Noncompliance - Pt counseled on importance of follow up appointments and taking medications as directed.    Meds ordered this encounter  Medications  . cloNIDine (CATAPRES) tablet 0.1 mg  . neomycin-bacitracin-polymyxin (NEOSPORIN) ointment    Sig: Apply 1 application topically every 12 (twelve) hours.    Dispense:  15 g    Refill:  0    Order Specific Question:   Supervising Provider    Answer:   Charlott Rakes [[id number]]  . metFORMIN (GLUCOPHAGE) 1000 MG tablet    Sig: TAKE 1 TABLET BY MOUTH TWICE DAILY WITH MEALS    Dispense:  60 tablet    Refill:  5    Please consider 90 day supplies to promote better adherence    Order Specific Question:   Supervising Provider    Answer:   Charlott Rakes [[id number]]  . lisinopril-hydrochlorothiazide (ZESTORETIC) 20-12.5 MG tablet    Sig: Take 2 tablets by mouth daily.    Dispense:  180 tablet    Refill:  1    Order Specific Question:   Supervising Provider    Answer:   Charlott Rakes [[id number]]  . Insulin Pen Needle (BD PEN NEEDLE MICRO U/F) 32G X 6 MM MISC    Sig: 1 each by Does not apply route at  bedtime.    Dispense:  100 each    Refill:  3    Order Specific Question:   Supervising Provider    Answer:   Charlott Rakes [[id number]]  . Insulin Detemir (LEVEMIR FLEXTOUCH) 100 UNIT/ML Pen    Sig: Inject 35 Units into the skin daily at 10 pm.    Dispense:  12 mL    Refill:  5    Order Specific Question:   Supervising Provider    Answer:   Charlott Rakes [[id number]]  . gabapentin (NEURONTIN) 300 MG capsule    Sig: Take 2 capsules (600 mg total) by mouth 3 (three) times daily.    Dispense:  180 capsule    Refill:  3    Order Specific Question:   Supervising Provider    Answer:   Charlott Rakes [[id number]]  . sitaGLIPtin (JANUVIA) 100 MG tablet    Sig: Take 1 tablet (100 mg total) by mouth daily.    Dispense:  30 tablet    Refill:  5    Order Specific Question:   Supervising Provider    Answer:   Charlott Rakes [[id number]]  . atorvastatin (LIPITOR) 40 MG tablet    Sig: Take 1 tablet (40 mg total) by mouth daily.    Dispense:  90 tablet    Refill:  3    Order Specific Question:   Supervising Provider    Answer:   Charlott Rakes [[id number]]    Follow-up: Return in about 4 weeks (around 08/24/2018) for bring glucose log for review. HTN.   Clent Demark PA

## 2018-07-28 LAB — COMPREHENSIVE METABOLIC PANEL
ALT: 15 IU/L (ref 0–32)
AST: 12 IU/L (ref 0–40)
Albumin/Globulin Ratio: 1.4 (ref 1.2–2.2)
Albumin: 4.4 g/dL (ref 3.6–4.8)
Alkaline Phosphatase: 141 IU/L — ABNORMAL HIGH (ref 39–117)
BUN/Creatinine Ratio: 11 — ABNORMAL LOW (ref 12–28)
BUN: 10 mg/dL (ref 8–27)
Bilirubin Total: 0.3 mg/dL (ref 0.0–1.2)
CALCIUM: 9.8 mg/dL (ref 8.7–10.3)
CO2: 24 mmol/L (ref 20–29)
CREATININE: 0.88 mg/dL (ref 0.57–1.00)
Chloride: 101 mmol/L (ref 96–106)
GFR, EST AFRICAN AMERICAN: 81 mL/min/{1.73_m2} (ref >59)
GFR, EST NON AFRICAN AMERICAN: 71 mL/min/{1.73_m2} (ref >59)
Globulin, Total: 3.1 g/dL (ref 1.5–4.5)
Glucose: 187 mg/dL — ABNORMAL HIGH (ref 65–99)
Potassium: 3.6 mmol/L (ref 3.5–5.2)
Sodium: 142 mmol/L (ref 134–144)
Total Protein: 7.5 g/dL (ref 6.0–8.5)

## 2018-07-30 ENCOUNTER — Telehealth (INDEPENDENT_AMBULATORY_CARE_PROVIDER_SITE_OTHER): Payer: Self-pay

## 2018-07-30 NOTE — Telephone Encounter (Signed)
Patient is aware that kidney filtration is normal. Advised patient to cut down on fried/fatty foods as there is some possible liver inflammation. Maryjean Mornempestt S Roberts, CMA

## 2018-07-30 NOTE — Telephone Encounter (Signed)
-----   Message from Loletta Specteroger David Gomez, PA-C sent at 07/28/2018  8:44 AM EST ----- Kidney filtration normal. Possible inflammation of the liver, try to cut down on fatty/fried foods.

## 2018-08-01 ENCOUNTER — Inpatient Hospital Stay (HOSPITAL_COMMUNITY)
Admission: EM | Admit: 2018-08-01 | Discharge: 2018-08-05 | DRG: 103 | Disposition: A | Discharge status: Home / Self Care | Payer: BC Managed Care – PPO | Attending: Internal Medicine | Admitting: Internal Medicine

## 2018-08-01 ENCOUNTER — Encounter (HOSPITAL_COMMUNITY): Payer: Self-pay

## 2018-08-01 ENCOUNTER — Emergency Department (HOSPITAL_COMMUNITY): Payer: BC Managed Care – PPO

## 2018-08-01 ENCOUNTER — Other Ambulatory Visit: Payer: Self-pay

## 2018-08-01 DIAGNOSIS — R509 Fever, unspecified: Secondary | ICD-10-CM

## 2018-08-01 DIAGNOSIS — I674 Hypertensive encephalopathy: Secondary | ICD-10-CM | POA: Diagnosis present

## 2018-08-01 DIAGNOSIS — E785 Hyperlipidemia, unspecified: Secondary | ICD-10-CM | POA: Diagnosis present

## 2018-08-01 DIAGNOSIS — I10 Essential (primary) hypertension: Secondary | ICD-10-CM | POA: Diagnosis present

## 2018-08-01 DIAGNOSIS — Z87891 Personal history of nicotine dependence: Secondary | ICD-10-CM

## 2018-08-01 DIAGNOSIS — I160 Hypertensive urgency: Secondary | ICD-10-CM | POA: Diagnosis present

## 2018-08-01 DIAGNOSIS — G2401 Drug induced subacute dyskinesia: Secondary | ICD-10-CM | POA: Diagnosis present

## 2018-08-01 DIAGNOSIS — R51 Headache: Principal | ICD-10-CM | POA: Diagnosis present

## 2018-08-01 DIAGNOSIS — R519 Headache, unspecified: Secondary | ICD-10-CM

## 2018-08-01 DIAGNOSIS — J45909 Unspecified asthma, uncomplicated: Secondary | ICD-10-CM | POA: Diagnosis present

## 2018-08-01 DIAGNOSIS — Z794 Long term (current) use of insulin: Secondary | ICD-10-CM

## 2018-08-01 DIAGNOSIS — E876 Hypokalemia: Secondary | ICD-10-CM | POA: Diagnosis present

## 2018-08-01 DIAGNOSIS — R531 Weakness: Secondary | ICD-10-CM

## 2018-08-01 DIAGNOSIS — E119 Type 2 diabetes mellitus without complications: Secondary | ICD-10-CM

## 2018-08-01 DIAGNOSIS — D649 Anemia, unspecified: Secondary | ICD-10-CM | POA: Diagnosis present

## 2018-08-01 DIAGNOSIS — Z885 Allergy status to narcotic agent status: Secondary | ICD-10-CM

## 2018-08-01 DIAGNOSIS — Z79899 Other long term (current) drug therapy: Secondary | ICD-10-CM

## 2018-08-01 HISTORY — DX: Headache, unspecified: R51.9

## 2018-08-01 HISTORY — DX: Headache: R51

## 2018-08-01 LAB — BASIC METABOLIC PANEL
ANION GAP: 13 (ref 5–15)
BUN: 10 mg/dL (ref 8–23)
CALCIUM: 9.3 mg/dL (ref 8.9–10.3)
CO2: 24 mmol/L (ref 22–32)
CREATININE: 0.92 mg/dL (ref 0.44–1.00)
Chloride: 98 mmol/L (ref 98–111)
GFR calc Af Amer: >60 mL/min (ref >60)
GFR calc non Af Amer: >60 mL/min (ref >60)
GLUCOSE: 326 mg/dL — AB (ref 70–99)
Potassium: 3.9 mmol/L (ref 3.5–5.1)
Sodium: 135 mmol/L (ref 135–145)

## 2018-08-01 LAB — URINALYSIS, ROUTINE W REFLEX MICROSCOPIC
BACTERIA UA: NONE SEEN
BILIRUBIN URINE: NEGATIVE
Glucose, UA: >=500 mg/dL — AB
KETONES UR: 20 mg/dL — AB
NITRITE: NEGATIVE
PROTEIN: 30 mg/dL — AB
SPECIFIC GRAVITY, URINE: 1.021 (ref 1.005–1.030)
pH: 7 (ref 5.0–8.0)

## 2018-08-01 LAB — CBG MONITORING, ED
Glucose-Capillary: 288 mg/dL — ABNORMAL HIGH (ref 70–99)
Glucose-Capillary: 331 mg/dL — ABNORMAL HIGH (ref 70–99)
Glucose-Capillary: 288 mg/dL — ABNORMAL HIGH (ref 70–99)

## 2018-08-01 LAB — CBC
HEMATOCRIT: 34.8 % — AB (ref 36.0–46.0)
Hemoglobin: 10.6 g/dL — ABNORMAL LOW (ref 12.0–15.0)
MCH: 23 pg — ABNORMAL LOW (ref 26.0–34.0)
MCHC: 30.5 g/dL (ref 30.0–36.0)
MCV: 75.7 fL — AB (ref 80.0–100.0)
Platelets: 311 10*3/uL (ref 150–400)
RBC: 4.6 MIL/uL (ref 3.87–5.11)
RDW: 13.1 % (ref 11.5–15.5)
WBC: 11.3 10*3/uL — ABNORMAL HIGH (ref 4.0–10.5)
nRBC: 0 % (ref 0.0–0.2)

## 2018-08-01 LAB — SEDIMENTATION RATE: Sed Rate: 52 mm/hr — ABNORMAL HIGH (ref 0–22)

## 2018-08-01 MED ORDER — HYDROCHLOROTHIAZIDE 12.5 MG PO CAPS
12.5000 mg | ORAL_CAPSULE | Freq: Once | ORAL | Status: AC
  Administered 2018-08-01: 12.5 mg via ORAL
  Filled 2018-08-01 (×2): qty 1

## 2018-08-01 MED ORDER — DIPHENHYDRAMINE HCL 50 MG/ML IJ SOLN: 25.0000 mg | Freq: Once | INTRAMUSCULAR | Status: DC

## 2018-08-01 MED ORDER — VANCOMYCIN HCL IN DEXTROSE 1-5 GM/200ML-% IV SOLN: 1000.0000 mg | Freq: Once | INTRAVENOUS | Status: DC

## 2018-08-01 MED ORDER — AMOXICILLIN-POT CLAVULANATE 875-125 MG PO TABS: 1.0000 | ORAL_TABLET | Freq: Two times a day (BID) | ORAL | 0 refills | Status: DC

## 2018-08-01 MED ORDER — KETOROLAC TROMETHAMINE 30 MG/ML IJ SOLN
15.0000 mg | Freq: Once | INTRAMUSCULAR | Status: AC
  Administered 2018-08-01: 15 mg via INTRAVENOUS
  Filled 2018-08-01: qty 1

## 2018-08-01 MED ORDER — SODIUM CHLORIDE 0.9 % IV SOLN: 2.0000 g | Freq: Once | INTRAVENOUS | Status: DC

## 2018-08-01 MED ORDER — LIDOCAINE-EPINEPHRINE (PF) 2 %-1:200000 IJ SOLN
20.0000 mL | Freq: Once | INTRAMUSCULAR | Status: AC
  Administered 2018-08-01: 20 mL
  Filled 2018-08-01: qty 20

## 2018-08-01 MED ORDER — INSULIN ASPART 100 UNIT/ML ~~LOC~~ SOLN
8.0000 [IU] | Freq: Once | SUBCUTANEOUS | Status: AC
  Administered 2018-08-01: 8 [IU] via SUBCUTANEOUS
  Filled 2018-08-01: qty 1

## 2018-08-01 MED ORDER — PROCHLORPERAZINE EDISYLATE 10 MG/2ML IJ SOLN
5.0000 mg | Freq: Once | INTRAMUSCULAR | Status: AC
  Administered 2018-08-01: 5 mg via INTRAMUSCULAR
  Filled 2018-08-01: qty 2

## 2018-08-01 MED ORDER — ONDANSETRON 8 MG PO TBDP
8.0000 mg | ORAL_TABLET | Freq: Once | ORAL | Status: AC
  Administered 2018-08-01: 8 mg via ORAL
  Filled 2018-08-01: qty 1

## 2018-08-01 MED ORDER — MAGNESIUM SULFATE 2 GM/50ML IV SOLN
2.0000 g | Freq: Once | INTRAVENOUS | Status: AC
  Administered 2018-08-01: 2 g via INTRAVENOUS
  Filled 2018-08-01: qty 50

## 2018-08-01 MED ORDER — HALOPERIDOL LACTATE 5 MG/ML IJ SOLN
2.0000 mg | Freq: Once | INTRAMUSCULAR | Status: AC
  Administered 2018-08-01: 2 mg via INTRAVENOUS
  Filled 2018-08-01: qty 1

## 2018-08-01 MED ORDER — METOCLOPRAMIDE HCL 5 MG/ML IJ SOLN
10.0000 mg | Freq: Once | INTRAMUSCULAR | Status: AC
  Administered 2018-08-01: 10 mg via INTRAMUSCULAR
  Filled 2018-08-01: qty 2

## 2018-08-01 MED ORDER — SODIUM CHLORIDE 0.9 % IV BOLUS
500.0000 mL | Freq: Once | INTRAVENOUS | Status: AC
  Administered 2018-08-01: 500 mL via INTRAVENOUS

## 2018-08-01 MED ORDER — AMOXICILLIN-POT CLAVULANATE 875-125 MG PO TABS
1.0000 | ORAL_TABLET | Freq: Once | ORAL | Status: AC
  Administered 2018-08-01: 1 via ORAL
  Filled 2018-08-01: qty 1

## 2018-08-01 MED ORDER — VALPROATE SODIUM 500 MG/5ML IV SOLN
500.0000 mg | Freq: Once | INTRAVENOUS | Status: AC
  Administered 2018-08-01: 500 mg via INTRAVENOUS
  Filled 2018-08-01: qty 5

## 2018-08-01 MED ORDER — LISINOPRIL 20 MG PO TABS
20.0000 mg | ORAL_TABLET | Freq: Once | ORAL | Status: AC
  Administered 2018-08-01: 20 mg via ORAL
  Filled 2018-08-01: qty 1

## 2018-08-01 MED ORDER — DIPHENHYDRAMINE HCL 50 MG/ML IJ SOLN
12.5000 mg | Freq: Once | INTRAMUSCULAR | Status: AC
  Administered 2018-08-01: 12.5 mg via INTRAMUSCULAR
  Filled 2018-08-01: qty 1

## 2018-08-01 MED ORDER — ACETAMINOPHEN 500 MG PO TABS
1000.0000 mg | ORAL_TABLET | Freq: Once | ORAL | Status: AC
  Administered 2018-08-01: 1000 mg via ORAL
  Filled 2018-08-01: qty 2

## 2018-08-01 MED ORDER — DEXAMETHASONE 4 MG PO TABS
10.0000 mg | ORAL_TABLET | Freq: Once | ORAL | Status: AC
  Administered 2018-08-01: 10 mg via ORAL
  Filled 2018-08-01: qty 2

## 2018-08-01 NOTE — ED Provider Notes (Addendum)
[facility] DEPT Provider Note   CSN: [id number] Arrival date & time: 08/01/18  3300     History   Chief Complaint Chief Complaint  Patient presents with  . Headache    HPI Cassandra Gould is a 62 y.o. female.  62 yo  F with a cc of right frontal headache.  Going on for the past couple days.  Has headaches like this in the past but not as severe.  Worse with bright lights and loud noises.  Goes across the frontal region to the occiput.  Described as throbbing.  Denies trauma denies fever denies cough or congestion denies sick contacts denies vomiting.  The history is provided by the patient.  Headache   This is a recurrent problem. The current episode started yesterday. The problem occurs constantly. The problem has been gradually worsening. The headache is associated with bright light and loud noise. The pain is located in the right unilateral region. The quality of the pain is described as throbbing. The pain is at a severity of 9/10. The pain is severe. Radiates to: occiuput. Pertinent negatives include no fever, no palpitations, no shortness of breath, no nausea and no vomiting. She has tried nothing for the symptoms. The treatment provided no relief.    Past Medical History:  Diagnosis Date  . Asthma    bronchites  . Bronchitis   . Diabetes mellitus   . Headache   . Hypertension     Patient Active Problem List   Diagnosis Date Noted  . Acute perforated appendicitis s/p lap appendectomy 08/31/2016 08/31/2016  . Seasonal allergies 08/31/2016  . Obesity (BMI 30-39.9) 08/31/2016  . Migraines 08/31/2016  . Hypertension   . Type 2 diabetes mellitus without complication, with long-term current use of insulin ([place])   . Asthma     Past Surgical History:  Procedure Laterality Date  . LAPAROSCOPIC APPENDECTOMY N/A 08/31/2016   Procedure: APPENDECTOMY LAPAROSCOPIC;  Surgeon: [name], MD;  Location: [facility] ORS;  Service: General;  Laterality:  N/A;  . TUBAL LIGATION       OB History   None      Home Medications    Prior to Admission medications   Medication Sig Start Date End Date Taking? Authorizing Provider  amoxicillin-clavulanate (AUGMENTIN) 875-125 MG tablet Take 1 tablet by mouth 2 (two) times daily. One po bid x 7 days 08/01/18   Deno Etienne, DO  atorvastatin (LIPITOR) 40 MG tablet Take 1 tablet (40 mg total) by mouth daily. 07/27/18   Clent Demark, PA-C  blood glucose meter kit and supplies KIT Dispense based on patient and insurance preference. Use up to four times daily as directed. (FOR ICD-9 250.00, 250.01). 12/26/16   Clent Demark, PA-C  gabapentin (NEURONTIN) 300 MG capsule Take 2 capsules (600 mg total) by mouth 3 (three) times daily. 07/27/18   Clent Demark, PA-C  Insulin Detemir (LEVEMIR FLEXTOUCH) 100 UNIT/ML Pen Inject 35 Units into the skin daily at 10 pm. 07/27/18   Clent Demark, PA-C  Insulin Pen Needle (BD PEN NEEDLE MICRO U/F) 32G X 6 MM MISC 1 each by Does not apply route at bedtime. 07/27/18   Clent Demark, PA-C  lisinopril-hydrochlorothiazide (ZESTORETIC) 20-12.5 MG tablet Take 2 tablets by mouth daily. 07/27/18   Clent Demark, PA-C  metFORMIN (GLUCOPHAGE) 1000 MG tablet TAKE 1 TABLET BY MOUTH TWICE DAILY WITH MEALS 07/27/18   Clent Demark, PA-C  neomycin-bacitracin-polymyxin (NEOSPORIN) ointment Apply 1 application topically  every 12 (twelve) hours. 07/27/18   Clent Demark, PA-C  sitaGLIPtin (JANUVIA) 100 MG tablet Take 1 tablet (100 mg total) by mouth daily. 07/27/18   Clent Demark, PA-C    Family History Family History  Problem Relation Age of Onset  . Hypertension Mother   . Diabetes Mother   . Diabetes Sister     Social History Social History   Tobacco Use  . Smoking status: Former Research scientist (life sciences)  . Smokeless tobacco: Never Used  Substance Use Topics  . Alcohol use: No  . Drug use: No     Allergies   Oxycodone   Review of  Systems Review of Systems  Constitutional: Negative for chills and fever.  HENT: Negative for congestion and rhinorrhea.   Eyes: Negative for redness and visual disturbance.  Respiratory: Negative for shortness of breath and wheezing.   Cardiovascular: Negative for chest pain and palpitations.  Gastrointestinal: Negative for nausea and vomiting.  Genitourinary: Negative for dysuria and urgency.  Musculoskeletal: Negative for arthralgias and myalgias.  Skin: Negative for pallor and wound.  Neurological: Positive for headaches. Negative for dizziness.     Physical Exam Updated Vital Signs BP (!) 178/109   Pulse (!) 115   Temp 97.8 F (36.6 C) (Oral)   Resp 16   SpO2 97%   Physical Exam  Constitutional: She is oriented to person, place, and time. She appears well-developed and well-nourished. No distress.  HENT:  Head: Normocephalic and atraumatic.  Swollen turbinates, posterior nasal drip,right frontal sinus pain, tm normal bilaterally.    Eyes: Pupils are equal, round, and reactive to light. EOM are normal.  Neck: Normal range of motion. Neck supple.  Cardiovascular: Normal rate and regular rhythm. Exam reveals no gallop and no friction rub.  No murmur heard. Pulmonary/Chest: Effort normal. She has no wheezes. She has no rales.  Abdominal: Soft. She exhibits no distension. There is no tenderness.  Musculoskeletal: She exhibits no edema or tenderness.  Neurological: She is alert and oriented to person, place, and time.  Splint to left wrist, mild grip strength weakness to the LUE.   Skin: Skin is warm and dry. She is not diaphoretic.  Psychiatric: She has a normal mood and affect. Her behavior is normal.  Nursing note and vitals reviewed.    ED Treatments / Results  Labs (all labs ordered are listed, but only abnormal results are displayed) Labs Reviewed  CBG MONITORING, ED - Abnormal; Notable for the following components:      Result Value   Glucose-Capillary 288 (*)     All other components within normal limits    EKG None  Radiology Ct Head Wo Contrast  Result Date: 08/01/2018 CLINICAL DATA:  Frontal headache, vomiting EXAM: CT HEAD WITHOUT CONTRAST TECHNIQUE: Contiguous axial images were obtained from the base of the skull through the vertex without intravenous contrast. COMPARISON:  None. FINDINGS: Brain: No evidence of acute infarction, hemorrhage, hydrocephalus, extra-axial collection or mass lesion/mass effect. Mild subcortical white matter and periventricular small vessel ischemic changes. Vascular: No hyperdense vessel or unexpected calcification. Skull: Normal. Negative for fracture or focal lesion. Sinuses/Orbits: The visualized paranasal sinuses are essentially clear. The mastoid air cells are unopacified. Other: None. IMPRESSION: No evidence of acute intracranial abnormality. Mild small vessel ischemic changes. Electronically Signed   By: Julian Hy M.D.   On: 08/01/2018 10:57   Mr Brain Wo Contrast  Result Date: 08/01/2018 CLINICAL DATA:  62 year old female with frontal headache since yesterday. Worst headache of life.  EXAM: MRI HEAD WITHOUT CONTRAST TECHNIQUE: Multiplanar, multiecho pulse sequences of the brain and surrounding structures were obtained without intravenous contrast. COMPARISON:  Head CT earlier today. FINDINGS: The examination had to be discontinued prior to completion by patient request. Axial T1 and coronal T2 weighted images were not obtained. Brain: No restricted diffusion to suggest acute infarction. No midline shift, mass effect, evidence of mass lesion, ventriculomegaly, extra-axial collection or acute intracranial hemorrhage. Cervicomedullary junction and pituitary are within normal limits. No chronic cerebral blood products identified. Scattered and patchy bilateral cerebral white matter T2 and FLAIR hyperintensity. No cortical encephalomalacia. The deep gray matter nuclei, brainstem, and cerebellum appear within  normal limits. Vascular: Major intracranial vascular flow voids are preserved. Skull and upper cervical spine: Partially visible cervical ACDF. Normal bone marrow signal. Sinuses/Orbits: Mildly Disconjugate gaze but otherwise negative orbits. Paranasal sinuses and mastoids are stable and well pneumatized. Other: Visualized scalp soft tissues are within normal limits. IMPRESSION: 1. The majority of the study was performed but the examination was discontinued prior to completion by patient request. 2.  No acute intracranial abnormality identified. 3. Moderate for age cerebral white matter signal changes are nonspecific but most commonly due to chronic small vessel disease. Electronically Signed   By: Genevie Ann M.D.   On: 08/01/2018 15:43    Procedures Procedures (including critical care time)  Medications Ordered in ED Medications  prochlorperazine (COMPAZINE) injection 5 mg (5 mg Intramuscular Given 08/01/18 0822)  diphenhydrAMINE (BENADRYL) injection 12.5 mg (12.5 mg Intramuscular Given 08/01/18 0822)  amoxicillin-clavulanate (AUGMENTIN) 875-125 MG per tablet 1 tablet (1 tablet Oral Given 08/01/18 0943)  metoCLOPramide (REGLAN) injection 10 mg (10 mg Intramuscular Given 08/01/18 0940)  diphenhydrAMINE (BENADRYL) injection 12.5 mg (12.5 mg Intramuscular Given 08/01/18 0939)  dexamethasone (DECADRON) tablet 10 mg (10 mg Oral Given 08/01/18 0944)  valproate (DEPACON) 500 mg in dextrose 5 % 50 mL IVPB (0 mg Intravenous Stopped 08/01/18 1336)  ketorolac (TORADOL) 30 MG/ML injection 15 mg (15 mg Intravenous Given 08/01/18 1145)  magnesium sulfate IVPB 2 g 50 mL (0 g Intravenous Stopped 08/01/18 1156)  haloperidol lactate (HALDOL) injection 2 mg (2 mg Intravenous Given 08/01/18 1336)     Initial Impression / Assessment and Plan / ED Course  I have reviewed the triage vital signs and the nursing notes.  Pertinent labs & imaging results that were available during my care of the patient were reviewed by  me and considered in my medical decision making (see chart for details).     62 yo F with a chief complaint of a right-sided headache.  Has headaches like this in the past but not as severe.  Will give a headache cocktail.  She does have some congestion and pain with percussion of the right frontal sinus.  Will treat as sinusitis.  Patient reassessed and states she feels no better and looks significantly better.  We will give Reglan more Benadryl and reassess.  Patient reassessed and had to be awakened from sleeping tells me that the pain is unchanged 10 out of 10.  At this point we will give IV magnesium Toradol CT the head.  CT imaging is unremarkable.  Patient reassessed and still complaining of a 10-10 headache.  Given Depakote magnesium.  Patient still complaining of 10 out of 10 headache.  MRI ordered.  Given Haldol.  Patient admits to some very mild improvement.  MRI is negative for acute intracranial pathology.  Patient's MRV is also negative.  At this point I will  discharge her home.  We will treat her as sinusitis.  PCP follow-up.  4:06 PM:  I have discussed the diagnosis/risks/treatment options with the patient and family and believe the pt to be eligible for discharge home to follow-up with PCP. We also discussed returning to the ED immediately if new or worsening sx occur. We discussed the sx which are most concerning (e.g., sudden worsening pain, fever, inability to tolerate by mouth) that necessitate immediate return.    I spent a long time discussing discharge with the patient and family.  The family was concerned that the member of the nursing staff did not know that the patient had diabetes.  The family had spent some time researching the medications that I had given the patient and they told me that one of the medications(prochlorperazine) upon their review had been banned in the [place] and worsens diabetes as well as caused headaches.  After much discussion it was decided  that the patient was unlikely to have a serious cause of the headache and the family and the patient agreed to be discharged home.  She was agitated after she had been given multiple medications.  In between each new round of medications I discussed the risks and benefits with the patient to each time demanded that she be given another medication because she continued to have a severe headache.  I felt that meningitis was unlikely as the patient had no neck pain or fever.  She had no meningeal signs on my exam.  I felt that subarachnoid hemorrhage is unlikely as the patient had a gradual onset of symptoms no radiation to the neck no exertional symptoms.  She also had a negative CT and MRI.  I felt the temporal arteritis was unlikely as the patient is much younger than would typically present with that she also had no focal temporal artery tenderness.  Other than photophobia she had no eye complaints.  I felt that most likely the patient had ongoing migraine symptoms versus sinus pathology.  She was given follow-up for neurology as well as prescription for Augmentin.  Nursing felt that the patient was too agitated to be discharged.  I thought this is likely secondary to her multiple medications that she was given.  She was hypertensive upon my discharge, I felt that her blood pressure was unlikely to be the source of the patient's headache as her blood pressure was not significantly elevated upon arrival and had gotten worse with the patient's agitation.    Medications administered to the patient during their visit and any new prescriptions provided to the patient are listed below.  Medications given during this visit Medications  prochlorperazine (COMPAZINE) injection 5 mg (5 mg Intramuscular Given 08/01/18 0822)  diphenhydrAMINE (BENADRYL) injection 12.5 mg (12.5 mg Intramuscular Given 08/01/18 0822)  amoxicillin-clavulanate (AUGMENTIN) 875-125 MG per tablet 1 tablet (1 tablet Oral Given  08/01/18 0943)  metoCLOPramide (REGLAN) injection 10 mg (10 mg Intramuscular Given 08/01/18 0940)  diphenhydrAMINE (BENADRYL) injection 12.5 mg (12.5 mg Intramuscular Given 08/01/18 0939)  dexamethasone (DECADRON) tablet 10 mg (10 mg Oral Given 08/01/18 0944)  valproate (DEPACON) 500 mg in dextrose 5 % 50 mL IVPB (0 mg Intravenous Stopped 08/01/18 1336)  ketorolac (TORADOL) 30 MG/ML injection 15 mg (15 mg Intravenous Given 08/01/18 1145)  magnesium sulfate IVPB 2 g 50 mL (0 g Intravenous Stopped 08/01/18 1156)  haloperidol lactate (HALDOL) injection 2 mg (2 mg Intravenous Given 08/01/18 1336)      The patient appears reasonably screen  and/or stabilized for discharge and I doubt any other medical condition or other [facility] requiring further screening, evaluation, or treatment in the ED at this time prior to discharge.        Final Clinical Impressions(s) / ED Diagnoses   Final diagnoses:  Sinus headache    ED Discharge Orders         Ordered    Ambulatory referral to Neurology    Comments:  New headache syndrome   08/01/18 1548    amoxicillin-clavulanate (AUGMENTIN) 875-125 MG tablet  2 times daily     08/01/18 1548           Deno Etienne, DO 08/01/18 [place], [place], DO 08/03/18 [phone]

## 2018-08-01 NOTE — ED Notes (Signed)
Bed: WA31 Expected date:  Expected time:  Means of arrival:  Comments: 

## 2018-08-01 NOTE — ED Notes (Signed)
Dr.Steinl attempted lumbar puncture without success.

## 2018-08-01 NOTE — ED Notes (Signed)
Patient transported to MRI 

## 2018-08-01 NOTE — ED Triage Notes (Signed)
She c/o frontal headache wrapping around to the right since yesterday. She denies fever, nor any other sign of illness. She states she has vomited x 4.

## 2018-08-01 NOTE — ED Notes (Signed)
Patient transported to CT 

## 2018-08-01 NOTE — ED Notes (Signed)
Bed: [id number] Expected date:  Expected time:  Means of arrival:  Comments: Room 31

## 2018-08-01 NOTE — ED Notes (Signed)
She is resting with two family members present. I get her an icepack to apply to her head and neck per her request, for which she thanks me.

## 2018-08-01 NOTE — Progress Notes (Signed)
Dr. Toniann FailKakrakandy asked to hold report, he is changing level of care. Also asked not to send upstairs, states Dr. Sung AmabileSimonds may perform LP first. Notified Hui, pt's current RN. Erick ColaceMelinda P Jonan Seufert. RN

## 2018-08-01 NOTE — ED Notes (Signed)
Bed: WA28 Expected date:  Expected time:  Means of arrival:  Comments: 

## 2018-08-01 NOTE — ED Provider Notes (Addendum)
Patient had been signed out by Dr Tyrone Nine, that pt with history headaches, had increased headache today, and that imaging of head including CT, MR/MRV negative. He indicates he gave her med for possible sinus, and then a headache cocktail of medication. Pt received multiple meds (steroid, benadryl, compazine, reglan, toradol, haldol) - after which she seemed to have dystonic/dyskinesia type of reaction, for which he has given her additional meds. States pt needs additional observation in ED after rxn to above meds.   Pt quite drowsy after the multiple meds received. CBG elevated, chem pending. Ivf. On exam, no temporal tenderness. No neck stiffness or rigidity. Pt w nausea.   Zofran, po/iv fluids.   Labs reviewed - glucose high, hc03 normal. On recheck, no nausea persist. Headache improved but not resolved. No neck stiffness or rigidity. ?right temporal tenderness - will add esr. Chest cta, no increased wob. abd soft nt.  Pt with persistent gen weakness, nausea - will consult hospitalists for admission, obs, ivf.    Discussed pt with Dr Hal Hope - will admit.   Dr Hal Hope called back again, states he is getting neurology consult, and requests LP.   Informed consent obtained after discussing procedure, risks and benefits. Pt/fam provided informed written consent.   Time out performed.   Sterile prep w betadine/drape. Local anesthesia skin, lido 2% with epi. Using sterile technique attempted LP x 2.  - unable to entire space/get csf return. Pt with remote hx lumbar fusion surgery.  Pt tolerated attempts well. No numbness or radicular pain during procedure.  Discussed w Dr Hal Hope - he indicates willl cover with abx, and obtain CTA head/neck, and get repeat flouro guided LP with IR.         Lajean Saver, MD 08/01/18 [phone]

## 2018-08-01 NOTE — ED Notes (Signed)
Dr. Denton LankSteinl at bedside to prepare for lumbar puncture.

## 2018-08-01 NOTE — ED Notes (Signed)
ED TO INPATIENT HANDOFF REPORT  Name/Age/Gender Cassandra Gould 62 y.o. female  Code Status Code Status History    Date Active Date Inactive Code Status Order ID Comments User Context   08/31/2016 0024 09/03/2016 1541 Full Code [id number]  [name], MD ED      Home/SNF/Other Home  Chief Complaint headache;emesis  Level of Care/Admitting Diagnosis ED Disposition    ED Disposition Condition Comment   [place]: [facility] [[id number]]  Level of Care: Telemetry [5]  Admit to tele based on following criteria: Monitor QTC interval  Diagnosis: Weakness [364680]  Admitting Physician: Rise Patience [phone]  Attending Physician: Rise Patience [phone]  PT Class (Do Not Modify): Observation [104]  PT Acc Code (Do Not Modify): Observation [10022]       Medical History Past Medical History:  Diagnosis Date  . Asthma    bronchites  . Bronchitis   . Diabetes mellitus   . Headache   . Hypertension     Allergies Allergies  Allergen Reactions  . Oxycodone Itching, Swelling and Rash    IV Location/Drains/Wounds Patient Lines/Drains/Airways Status   Active Line/Drains/Airways    Name:   Placement date:   Placement time:   Site:   Days:   Peripheral IV 08/01/18 Right Antecubital   08/01/18    2241    Antecubital   less than 1          Labs/Imaging Results for orders placed or performed during the hospital encounter of 08/01/18 (from the past 48 hour(s))  CBG monitoring, ED     Status: Abnormal   Collection Time: 08/01/18 12:42 PM  Result Value Ref Range   Glucose-Capillary 288 (H) 70 - 99 mg/dL  CBG monitoring, ED     Status: Abnormal   Collection Time: 08/01/18  5:36 PM  Result Value Ref Range   Glucose-Capillary 331 (H) 70 - 99 mg/dL  CBG monitoring, ED     Status: Abnormal   Collection Time: 08/01/18  8:28 PM  Result Value Ref Range   Glucose-Capillary 288 (H) 70 - 99 mg/dL  CBC     Status: Abnormal   Collection  Time: 08/01/18  8:44 PM  Result Value Ref Range   WBC 11.3 (H) 4.0 - 10.5 K/uL   RBC 4.60 3.87 - 5.11 MIL/uL   Hemoglobin 10.6 (L) 12.0 - 15.0 g/dL   HCT 34.8 (L) 36.0 - 46.0 %   MCV 75.7 (L) 80.0 - 100.0 fL   MCH 23.0 (L) 26.0 - 34.0 pg   MCHC 30.5 30.0 - 36.0 g/dL   RDW 13.1 11.5 - 15.5 %   Platelets 311 150 - 400 K/uL   nRBC 0.0 0.0 - 0.2 %    Comment: Performed at [facility], [place] [address] [place], [place] [address]  Basic metabolic panel     Status: Abnormal   Collection Time: 08/01/18  8:44 PM  Result Value Ref Range   Sodium 135 135 - 145 mmol/L   Potassium 3.9 3.5 - 5.1 mmol/L   Chloride 98 98 - 111 mmol/L   CO2 24 22 - 32 mmol/L   Glucose, Bld 326 (H) 70 - 99 mg/dL   BUN 10 8 - 23 mg/dL   Creatinine, Ser 0.92 0.44 - 1.00 mg/dL   Calcium 9.3 8.9 - 10.3 mg/dL   GFR calc non Af Amer >60 >60 mL/min   GFR calc Af Amer >60 >60 mL/min    Comment: (NOTE)  The eGFR has been calculated using the CKD EPI equation. This calculation has not been validated in all clinical situations. eGFR's persistently <60 mL/min signify possible Chronic Kidney Disease.    Anion gap 13 5 - 15    Comment: Performed at [facility], [place] [address] [place], [place] [address]  Urinalysis, Routine w reflex microscopic     Status: Abnormal   Collection Time: 08/01/18  9:30 PM  Result Value Ref Range   Color, Urine STRAW (A) YELLOW   APPearance CLEAR CLEAR   Specific Gravity, Urine 1.021 1.005 - 1.030   pH 7.0 5.0 - 8.0   Glucose, UA >=500 (A) NEGATIVE mg/dL   Hgb urine dipstick SMALL (A) NEGATIVE   Bilirubin Urine NEGATIVE NEGATIVE   Ketones, ur 20 (A) NEGATIVE mg/dL   Protein, ur 30 (A) NEGATIVE mg/dL   Nitrite NEGATIVE NEGATIVE   Leukocytes, UA TRACE (A) NEGATIVE   RBC / HPF 0-5 0 - 5 RBC/hpf   WBC, UA 11-20 0 - 5 WBC/hpf   Bacteria, UA NONE SEEN NONE SEEN   Squamous Epithelial / LPF 6-10 0 - 5    Comment: Performed at [facility], [place] [address], [place],  [address]   Ct Head Wo Contrast  Result Date: 08/01/2018 CLINICAL DATA:  Frontal headache, vomiting EXAM: CT HEAD WITHOUT CONTRAST TECHNIQUE: Contiguous axial images were obtained from the base of the skull through the vertex without intravenous contrast. COMPARISON:  None. FINDINGS: Brain: No evidence of acute infarction, hemorrhage, hydrocephalus, extra-axial collection or mass lesion/mass effect. Mild subcortical white matter and periventricular small vessel ischemic changes. Vascular: No hyperdense vessel or unexpected calcification. Skull: Normal. Negative for fracture or focal lesion. Sinuses/Orbits: The visualized paranasal sinuses are essentially clear. The mastoid air cells are unopacified. Other: None. IMPRESSION: No evidence of acute intracranial abnormality. Mild small vessel ischemic changes. Electronically Signed   By: Julian Hy M.D.   On: 08/01/2018 10:57   Mr Brain Wo Contrast  Result Date: 08/01/2018 CLINICAL DATA:  62 year old female with frontal headache since yesterday. Worst headache of life. EXAM: MRI HEAD WITHOUT CONTRAST TECHNIQUE: Multiplanar, multiecho pulse sequences of the brain and surrounding structures were obtained without intravenous contrast. COMPARISON:  Head CT earlier today. FINDINGS: The examination had to be discontinued prior to completion by patient request. Axial T1 and coronal T2 weighted images were not obtained. Brain: No restricted diffusion to suggest acute infarction. No midline shift, mass effect, evidence of mass lesion, ventriculomegaly, extra-axial collection or acute intracranial hemorrhage. Cervicomedullary junction and pituitary are within normal limits. No chronic cerebral blood products identified. Scattered and patchy bilateral cerebral white matter T2 and FLAIR hyperintensity. No cortical encephalomalacia. The deep gray matter nuclei, brainstem, and cerebellum appear within normal limits.  Vascular: Major intracranial vascular flow voids are preserved. Skull and upper cervical spine: Partially visible cervical ACDF. Normal bone marrow signal. Sinuses/Orbits: Mildly Disconjugate gaze but otherwise negative orbits. Paranasal sinuses and mastoids are stable and well pneumatized. Other: Visualized scalp soft tissues are within normal limits. IMPRESSION: 1. The majority of the study was performed but the examination was discontinued prior to completion by patient request. 2.  No acute intracranial abnormality identified. 3. Moderate for age cerebral white matter signal changes are nonspecific but most commonly due to chronic small vessel disease. Electronically Signed   By: Genevie Ann M.D.   On: 08/01/2018 15:43   None  Pending Labs Unresulted Labs (From admission, onward)    Start  Ordered   08/01/18 2207  Sedimentation rate  ONCE - STAT,   STAT     08/01/18 2206          Vitals/Pain Today's Vitals   08/01/18 1731 08/01/18 2011 08/01/18 2011 08/01/18 2218  BP: (!) 180/87  (!) 144/82 (!) 163/73  Pulse: (!) 112  (!) 110 (!) 105  Resp: '16  20 20  ' Temp:  100 F (37.8 C)    TempSrc:  Oral    SpO2: 94%  97% 99%  PainSc:   10-Worst pain ever     Isolation Precautions No active isolations  Medications Medications  diphenhydrAMINE (BENADRYL) injection 25 mg (25 mg Intravenous Not Given 08/01/18 2054)  lisinopril (PRINIVIL,ZESTRIL) tablet 20 mg (has no administration in time range)  hydrochlorothiazide (MICROZIDE) capsule 12.5 mg (has no administration in time range)  insulin aspart (novoLOG) injection 8 Units (has no administration in time range)  ondansetron (ZOFRAN-ODT) disintegrating tablet 8 mg (has no administration in time range)  prochlorperazine (COMPAZINE) injection 5 mg (5 mg Intramuscular Given 08/01/18 0822)  diphenhydrAMINE (BENADRYL) injection 12.5 mg (12.5 mg Intramuscular Given 08/01/18 0822)  amoxicillin-clavulanate (AUGMENTIN) 875-125 MG per tablet 1 tablet  (1 tablet Oral Given 08/01/18 0943)  metoCLOPramide (REGLAN) injection 10 mg (10 mg Intramuscular Given 08/01/18 0940)  diphenhydrAMINE (BENADRYL) injection 12.5 mg (12.5 mg Intramuscular Given 08/01/18 0939)  dexamethasone (DECADRON) tablet 10 mg (10 mg Oral Given 08/01/18 0944)  valproate (DEPACON) 500 mg in dextrose 5 % 50 mL IVPB (0 mg Intravenous Stopped 08/01/18 1336)  ketorolac (TORADOL) 30 MG/ML injection 15 mg (15 mg Intravenous Given 08/01/18 1145)  magnesium sulfate IVPB 2 g 50 mL (0 g Intravenous Stopped 08/01/18 1156)  haloperidol lactate (HALDOL) injection 2 mg (2 mg Intravenous Given 08/01/18 1336)  sodium chloride 0.9 % bolus 500 mL (0 mLs Intravenous Stopped 08/01/18 2114)  acetaminophen (TYLENOL) tablet 1,000 mg (1,000 mg Oral Given 08/01/18 2053)    Mobility walks

## 2018-08-02 ENCOUNTER — Observation Stay (HOSPITAL_COMMUNITY): Payer: BC Managed Care – PPO

## 2018-08-02 ENCOUNTER — Other Ambulatory Visit: Payer: Self-pay

## 2018-08-02 ENCOUNTER — Encounter (HOSPITAL_COMMUNITY): Payer: Self-pay | Admitting: Internal Medicine

## 2018-08-02 DIAGNOSIS — D649 Anemia, unspecified: Secondary | ICD-10-CM | POA: Diagnosis present

## 2018-08-02 DIAGNOSIS — Z794 Long term (current) use of insulin: Secondary | ICD-10-CM

## 2018-08-02 DIAGNOSIS — Z79899 Other long term (current) drug therapy: Secondary | ICD-10-CM | POA: Diagnosis not present

## 2018-08-02 DIAGNOSIS — E785 Hyperlipidemia, unspecified: Secondary | ICD-10-CM | POA: Diagnosis present

## 2018-08-02 DIAGNOSIS — Z87891 Personal history of nicotine dependence: Secondary | ICD-10-CM | POA: Diagnosis not present

## 2018-08-02 DIAGNOSIS — J45909 Unspecified asthma, uncomplicated: Secondary | ICD-10-CM | POA: Diagnosis present

## 2018-08-02 DIAGNOSIS — E119 Type 2 diabetes mellitus without complications: Secondary | ICD-10-CM

## 2018-08-02 DIAGNOSIS — I160 Hypertensive urgency: Secondary | ICD-10-CM | POA: Diagnosis present

## 2018-08-02 DIAGNOSIS — I10 Essential (primary) hypertension: Secondary | ICD-10-CM | POA: Diagnosis not present

## 2018-08-02 DIAGNOSIS — R519 Headache, unspecified: Secondary | ICD-10-CM | POA: Diagnosis present

## 2018-08-02 DIAGNOSIS — E876 Hypokalemia: Secondary | ICD-10-CM | POA: Diagnosis present

## 2018-08-02 DIAGNOSIS — R51 Headache: Secondary | ICD-10-CM | POA: Diagnosis present

## 2018-08-02 DIAGNOSIS — G2401 Drug induced subacute dyskinesia: Secondary | ICD-10-CM | POA: Diagnosis present

## 2018-08-02 DIAGNOSIS — I674 Hypertensive encephalopathy: Secondary | ICD-10-CM | POA: Diagnosis present

## 2018-08-02 DIAGNOSIS — Z885 Allergy status to narcotic agent status: Secondary | ICD-10-CM | POA: Diagnosis not present

## 2018-08-02 LAB — GLUCOSE, CAPILLARY
GLUCOSE-CAPILLARY: 188 mg/dL — AB (ref 70–99)
GLUCOSE-CAPILLARY: 244 mg/dL — AB (ref 70–99)
GLUCOSE-CAPILLARY: 275 mg/dL — AB (ref 70–99)
Glucose-Capillary: 191 mg/dL — ABNORMAL HIGH (ref 70–99)
Glucose-Capillary: 153 mg/dL — ABNORMAL HIGH (ref 70–99)
Glucose-Capillary: 233 mg/dL — ABNORMAL HIGH (ref 70–99)
Glucose-Capillary: 223 mg/dL — ABNORMAL HIGH (ref 70–99)

## 2018-08-02 LAB — MRSA PCR SCREENING: MRSA by PCR: POSITIVE — AB

## 2018-08-02 LAB — RAPID URINE DRUG SCREEN, HOSP PERFORMED
AMPHETAMINES: NOT DETECTED
Barbiturates: NOT DETECTED
Benzodiazepines: NOT DETECTED
COCAINE: NOT DETECTED
Opiates: NOT DETECTED
Tetrahydrocannabinol: NOT DETECTED

## 2018-08-02 LAB — AMMONIA: AMMONIA: 29 umol/L (ref 9–35)

## 2018-08-02 MED ORDER — VANCOMYCIN HCL 10 G IV SOLR
1750.0000 mg | Freq: Every day | INTRAVENOUS | Status: DC
  Administered 2018-08-03: 1750 mg via INTRAVENOUS
  Filled 2018-08-02: qty 1750

## 2018-08-02 MED ORDER — IOPAMIDOL (ISOVUE-370) INJECTION 76%
100.0000 mL | Freq: Once | INTRAVENOUS | Status: AC | PRN
  Administered 2018-08-02: 100 mL via INTRAVENOUS

## 2018-08-02 MED ORDER — SODIUM CHLORIDE 0.9 % IV SOLN
2.0000 g | INTRAVENOUS | Status: AC
  Administered 2018-08-02: 2 g via INTRAVENOUS
  Filled 2018-08-02: qty 2

## 2018-08-02 MED ORDER — ENALAPRILAT 1.25 MG/ML IV SOLN
1.2500 mg | Freq: Once | INTRAVENOUS | Status: AC
  Administered 2018-08-02: 1.25 mg via INTRAVENOUS
  Filled 2018-08-02: qty 1

## 2018-08-02 MED ORDER — ACETAMINOPHEN 325 MG PO TABS
650.0000 mg | ORAL_TABLET | Freq: Four times a day (QID) | ORAL | Status: DC | PRN
  Administered 2018-08-02 – 2018-08-05 (×2): 650 mg via ORAL
  Filled 2018-08-02 (×2): qty 2

## 2018-08-02 MED ORDER — IOPAMIDOL (ISOVUE-370) INJECTION 76%
INTRAVENOUS | Status: AC
  Filled 2018-08-02: qty 100

## 2018-08-02 MED ORDER — HYDRALAZINE HCL 20 MG/ML IJ SOLN: 10.0000 mg | INTRAMUSCULAR | Status: DC | PRN

## 2018-08-02 MED ORDER — MUPIROCIN 2 % EX OINT
1.0000 "application " | TOPICAL_OINTMENT | Freq: Two times a day (BID) | CUTANEOUS | Status: DC
  Administered 2018-08-02 – 2018-08-05 (×6): 1 via NASAL
  Filled 2018-08-02: qty 22

## 2018-08-02 MED ORDER — SODIUM CHLORIDE 0.9 % IV SOLN
2.0000 g | Freq: Two times a day (BID) | INTRAVENOUS | Status: DC
  Administered 2018-08-02 (×2): 2 g via INTRAVENOUS
  Filled 2018-08-02: qty 20
  Filled 2018-08-02 (×2): qty 2

## 2018-08-02 MED ORDER — NICARDIPINE HCL IN NACL 20-0.86 MG/200ML-% IV SOLN
3.0000 mg/h | INTRAVENOUS | Status: DC
  Administered 2018-08-02: 10 mg/h via INTRAVENOUS
  Administered 2018-08-02: 5 mg/h via INTRAVENOUS
  Administered 2018-08-02: 10 mg/h via INTRAVENOUS
  Administered 2018-08-03 (×3): 7.5 mg/h via INTRAVENOUS
  Filled 2018-08-02 (×8): qty 200

## 2018-08-02 MED ORDER — AMLODIPINE BESYLATE 10 MG PO TABS
10.0000 mg | ORAL_TABLET | Freq: Every day | ORAL | Status: DC
  Administered 2018-08-02 – 2018-08-05 (×4): 10 mg via ORAL
  Filled 2018-08-02 (×4): qty 1

## 2018-08-02 MED ORDER — DEXTROSE 5 % IV SOLN
850.0000 mg | Freq: Three times a day (TID) | INTRAVENOUS | Status: DC
  Filled 2018-08-02: qty 17

## 2018-08-02 MED ORDER — LIDOCAINE HCL 1 % IJ SOLN
INTRAMUSCULAR | Status: AC
  Filled 2018-08-02: qty 20

## 2018-08-02 MED ORDER — KETOROLAC TROMETHAMINE 15 MG/ML IJ SOLN: 15.0000 mg | Freq: Four times a day (QID) | INTRAMUSCULAR | Status: AC | PRN

## 2018-08-02 MED ORDER — SODIUM CHLORIDE 0.9 % IV SOLN
INTRAVENOUS | Status: DC
  Administered 2018-08-02: 03:00:00 via INTRAVENOUS

## 2018-08-02 MED ORDER — CHLORHEXIDINE GLUCONATE CLOTH 2 % EX PADS
6.0000 | MEDICATED_PAD | Freq: Every day | CUTANEOUS | Status: DC
  Administered 2018-08-04 – 2018-08-05 (×2): 6 via TOPICAL

## 2018-08-02 MED ORDER — SODIUM CHLORIDE 0.9 % IV SOLN
2.0000 g | INTRAVENOUS | Status: DC
  Administered 2018-08-02 – 2018-08-03 (×8): 2 g via INTRAVENOUS
  Filled 2018-08-02 (×8): qty 2
  Filled 2018-08-02 (×2): qty 2000

## 2018-08-02 MED ORDER — VANCOMYCIN HCL 10 G IV SOLR
2500.0000 mg | Freq: Once | INTRAVENOUS | Status: AC
  Administered 2018-08-02: 2500 mg via INTRAVENOUS
  Filled 2018-08-02: qty 500

## 2018-08-02 MED ORDER — ACETAMINOPHEN 650 MG RE SUPP: 650.0000 mg | Freq: Four times a day (QID) | RECTAL | Status: DC | PRN

## 2018-08-02 MED ORDER — LABETALOL HCL 5 MG/ML IV SOLN
10.0000 mg | INTRAVENOUS | Status: DC | PRN
  Administered 2018-08-02 – 2018-08-04 (×3): 10 mg via INTRAVENOUS
  Filled 2018-08-02 (×2): qty 4

## 2018-08-02 MED ORDER — ONDANSETRON HCL 4 MG PO TABS
4.0000 mg | ORAL_TABLET | Freq: Four times a day (QID) | ORAL | Status: DC | PRN
  Administered 2018-08-04: 4 mg via ORAL
  Filled 2018-08-02: qty 1

## 2018-08-02 MED ORDER — SODIUM CHLORIDE (PF) 0.9 % IJ SOLN
INTRAMUSCULAR | Status: AC
  Filled 2018-08-02: qty 50

## 2018-08-02 MED ORDER — DEXTROSE 5 % IV SOLN
850.0000 mg | Freq: Three times a day (TID) | INTRAVENOUS | Status: AC
  Administered 2018-08-02 (×2): 850 mg via INTRAVENOUS
  Filled 2018-08-02 (×3): qty 17

## 2018-08-02 MED ORDER — INSULIN ASPART 100 UNIT/ML ~~LOC~~ SOLN
0.0000 [IU] | SUBCUTANEOUS | Status: DC
  Administered 2018-08-02: 3 [IU] via SUBCUTANEOUS
  Administered 2018-08-02: 5 [IU] via SUBCUTANEOUS
  Administered 2018-08-02: 2 [IU] via SUBCUTANEOUS
  Administered 2018-08-02: 3 [IU] via SUBCUTANEOUS
  Administered 2018-08-02: 2 [IU] via SUBCUTANEOUS
  Administered 2018-08-02: 3 [IU] via SUBCUTANEOUS
  Administered 2018-08-02: 5 [IU] via SUBCUTANEOUS
  Administered 2018-08-03 (×2): 3 [IU] via SUBCUTANEOUS
  Administered 2018-08-03: 5 [IU] via SUBCUTANEOUS
  Administered 2018-08-03: 3 [IU] via SUBCUTANEOUS

## 2018-08-02 MED ORDER — ONDANSETRON HCL 4 MG/2ML IJ SOLN
4.0000 mg | Freq: Four times a day (QID) | INTRAMUSCULAR | Status: DC | PRN
  Administered 2018-08-03: 4 mg via INTRAVENOUS
  Filled 2018-08-02: qty 2

## 2018-08-02 NOTE — Progress Notes (Signed)
Patient is a 62 year old female with past medical history of diabetes type 2, hypertension, hyperlipidemia who presented to the emergency department with complaints of intractable headache.  She was also started on broad-spectrum antibiotics to cover for meningitis.  Lumbar puncture was attempted in the emergency department but was not successful.  Lumbar puncture with fluoroscopic guidance has been ordered. Patient seen and examined at the bedside this morning.  Her headache just slightly improved this morning, she is still complaining of significant headache on her frontal region and its radiates into her neck on the right side.  I didnot not appreciate any significant neck rigidity. She was noted to be slightly hypertensive during my evaluation. She does not have any focal neurological deficits. I will request for neurological consultation today because her headache is intractable and has not improved with different medications. Patient seen by Dr. Toniann FailKakrakandy this morning. Discussed with daughter at the bedside.

## 2018-08-02 NOTE — Progress Notes (Signed)
Pharmacy Antibiotic Note  Cassandra Gould is a 62 y.o. female admitted on 08/01/2018 with meningitis.  Pharmacy has been consulted for vancomycin and acyclovir dosing.  Plan: Rocephin 2 Gm IV q12h (MD- rx increased from QD to Q12H) Ampicillin 2 Gm IV q4h Vancomycin 2500 mg x1 then 1750 mg IV q24h for est AUC = 556 Goal AUC >500 in meningitis Acyclovir 850 mg IV q8h F/u scr/cultures/levels  Height: 5\' 10"  (177.8 cm) IBW/kg (Calculated) : 68.5  Temp (24hrs), Avg:99.1 F (37.3 C), Min:97.8 F (36.6 C), Max:100 F (37.8 C)  Recent Labs  Lab 07/27/18 1204 08/01/18 2044  WBC  --  11.3*  CREATININE 0.88 0.92    Estimated Creatinine Clearance: 86.6 mL/min (by C-G formula based on SCr of 0.92 mg/dL).    Allergies  Allergen Reactions  . Oxycodone Itching, Swelling and Rash    Antimicrobials this admission: 11/18 rocephin >>  11/18 vancomycin >>  11/18 acyclovir >> 11/18 ampicillin >>  Dose adjustments this admission:   Microbiology results:  BCx:   UCx:    Sputum:    MRSA PCR:   Thank you for allowing pharmacy to be a part of this patient's care.  Cassandra Gould, Cassandra Gould 08/02/2018 1:32 AM

## 2018-08-02 NOTE — Consult Note (Addendum)
Neurology Consultation  Reason for Consult: Headache Referring Physician: [facility]  CC: Headache  History is obtained from: Patient  HPI: Cassandra Gould is a 62 y.o. female with past medical history of diabetes, hypertension, hyperlipidemia presented to the ER yesterday complaining of a headache that started over the night.  Patient states that she does not usually get a headache.  While in the ER she described a frontal throbbing headache in nature.  Along with weakness in extremities.  Patient did obtain an MRI, MRA, CTA, MRV of head which were all negative.  While in the ED she was also given Compazine, Benadryl followed by Reglan which gave her some tardive dyskinesia thus was stopped.  She was also given Depakote.  All of which did not stop her headache.  Patient does not describe any nuchal rigidity.  While dictating this HPI patient is refusing LP.  Patient does endorse photophobia and phonophobia.  Patient states that she has a 9/10 headache at this point in time but she cannot describe whether it is throbbing/piercing/tight band/pressure.  Nurse did note that when her blood pressure was lowered she did state that her headache improved.   ROS: ROS was performed and is negative except as noted in the HPI.  Past Medical History:  Diagnosis Date  . Asthma    bronchites  . Bronchitis   . Diabetes mellitus   . Headache   . Hypertension      Family History  Problem Relation Age of Onset  . Hypertension Mother   . Diabetes Mother   . Diabetes Sister      Social History:   reports that she has quit smoking. She has never used smokeless tobacco. She reports that she does not drink alcohol or use drugs.  Medications  Current Facility-Administered Medications:  .  acetaminophen (TYLENOL) tablet 650 mg, 650 mg, Oral, Q6H PRN, 650 mg at 08/02/18 0459 **OR** acetaminophen (TYLENOL) suppository 650 mg, 650 mg, Rectal, Q6H PRN, Eduard Clos, MD .  acyclovir (ZOVIRAX) 850 mg  in dextrose 5 % 150 mL IVPB, 850 mg, Intravenous, Q8H, Lorenza Evangelist, RPH, Stopped at 08/02/18 1000 .  amLODipine (NORVASC) tablet 10 mg, 10 mg, Oral, Daily, Adhikari, Amrit, MD, 10 mg at 08/02/18 1155 .  ampicillin (OMNIPEN) 2 g in sodium chloride 0.9 % 100 mL IVPB, 2 g, Intravenous, Q4H, Eduard Clos, MD, Last Rate: 300 mL/hr at 08/02/18 1050, 2 g at 08/02/18 1050 .  cefTRIAXone (ROCEPHIN) 2 g in sodium chloride 0.9 % 100 mL IVPB, 2 g, Intravenous, Q12H, Eduard Clos, MD, Last Rate: 200 mL/hr at 08/02/18 1158, 2 g at 08/02/18 1158 .  insulin aspart (novoLOG) injection 0-9 Units, 0-9 Units, Subcutaneous, Q4H, Eduard Clos, MD, 2 Units at 08/02/18 [phone] .  ketorolac (TORADOL) 15 MG/ML injection 15 mg, 15 mg, Intravenous, Q6H PRN, Eduard Clos, MD .  labetalol (NORMODYNE,TRANDATE) injection 10 mg, 10 mg, Intravenous, Q2H PRN, Burnadette Pop, MD, 10 mg at 08/02/18 0958 .  ondansetron (ZOFRAN) tablet 4 mg, 4 mg, Oral, Q6H PRN **OR** ondansetron (ZOFRAN) injection 4 mg, 4 mg, Intravenous, Q6H PRN, Eduard Clos, MD .  Melene Muller ON 08/03/2018] vancomycin (VANCOCIN) 1,750 mg in sodium chloride 0.9 % 500 mL IVPB, 1,750 mg, Intravenous, Q0600, Lorenza Evangelist, RPH   Exam: Current vital signs: BP (!) 186/64   Pulse 93   Temp 99.5 F (37.5 C) (Axillary)   Resp 20   Ht 5\' 10"  (1.778 m)   SpO2  99%   BMI 35.87 kg/m  Vital signs in last 24 hours: Temp:  [99.4 F (37.4 C)-100 F (37.8 C)] 99.5 F (37.5 C) (11/18 0800) Pulse Rate:  [86-115] 93 (11/18 1052) Resp:  [13-23] 20 (11/18 1052) BP: (93-197)/(64-109) 186/64 (11/18 1155) SpO2:  [94 %-100 %] 99 % (11/18 1052)  Physical Exam  Constitutional: Appears well-developed and well-nourished.  Psych: Affect dysthymic. Mildly agitated Eyes: No scleral injection HENT: No OP obstrucion.  No significant nuchal rigidity Head: Normocephalic.  Cardiovascular: Normal rate and regular rhythm.  Respiratory: Effort  normal, non-labored breathing GI: Soft.  No distension. There is no tenderness.  Skin: WDI  Neuro: Mental Status: Patient is awake alert oriented, able to name objects and follow three-step commands. Cranial Nerves: II: Left homonymous hemianopsia is noted.  III,IV, VI: Difficulty with visual tracking to the left. Pursuits have a saccadic quality. No nystagmus. Pupils are equal, round, and reactive to light.   V: Facial sensation is symmetric to temperature VII: Facial movement is symmetric.  VIII: hearing is intact to voice X: Uvula elevates symmetrically XI: Shoulder shrug is symmetric. XII: tongue is midline without atrophy or fasciculations.  Motor: Tone is normal. Bulk is normal. 4/5 strength was present in upper extremities, with 4/5 strength in lower extremities in the context of poor effort.  Sensory: Sensation is symmetric to light touch and temperature in the arms and legs. Deep Tendon Reflexes: 2+ and symmetric in the biceps and patellae.  Plantars: Toes are downgoing bilaterally.  Cerebellar: FNF and HKS are intact bilaterally  Labs I have reviewed labs in epic and the results pertinent to this consultation are:   CBC    Component Value Date/Time   WBC 11.3 (H) 08/01/2018 2044   RBC 4.60 08/01/2018 2044   HGB 10.6 (L) 08/01/2018 2044   HCT 34.8 (L) 08/01/2018 2044   PLT 311 08/01/2018 2044   MCV 75.7 (L) 08/01/2018 2044   MCH 23.0 (L) 08/01/2018 2044   MCHC 30.5 08/01/2018 2044   RDW 13.1 08/01/2018 2044   LYMPHSABS 1.2 08/30/2016 1701   MONOABS 0.6 08/30/2016 1701   EOSABS 0.0 08/30/2016 1701   BASOSABS 0.0 08/30/2016 1701    CMP     Component Value Date/Time   NA 135 08/01/2018 2044   NA 142 07/27/2018 1204   K 3.9 08/01/2018 2044   CL 98 08/01/2018 2044   CO2 24 08/01/2018 2044   GLUCOSE 326 (H) 08/01/2018 2044   BUN 10 08/01/2018 2044   BUN 10 07/27/2018 1204   CREATININE 0.92 08/01/2018 2044   CALCIUM 9.3 08/01/2018 2044   PROT 7.5  07/27/2018 1204   ALBUMIN 4.4 07/27/2018 1204   AST 12 07/27/2018 1204   ALT 15 07/27/2018 1204   ALKPHOS 141 (H) 07/27/2018 1204   BILITOT 0.3 07/27/2018 1204   GFRNONAA >60 08/01/2018 2044   GFRAA >60 08/01/2018 2044    Lipid Panel     Component Value Date/Time   CHOL 162 12/26/2016 1206   TRIG 110 12/26/2016 1206   HDL 48 12/26/2016 1206   CHOLHDL 3.4 12/26/2016 1206   LDLCALC 92 12/26/2016 1206     Imaging I have reviewed the images obtained:  As noted above CT Angie of head/neck along with MRI of brain and head CT were all negative  Felicie MornDavid Smith PA-C [facility] [phone] 08/02/2018, 12:34 PM     Assessment: 62 year old female with intractable headache.  1. MRI brain negative. CTA of head and neck  negative.  2. No nuchal rigidity on exam.  3. Given that patient's headache is improved with lowering of blood pressure, this may be a hypertensive headache. However, given left homonymous hemianopsia on exam in the context of headache and negative MRI brain, complicated migraine is felt to be the most likely component of the DDx.   4. Also on DDx is possible meningitis given AMS and headache, as well as skin being quite warm to touch, although no fever has been documented.   Recommendations: - Patient is refusing LP. Continue empiric antibiotic treatment for possible meningitis.  - Would recommend a titratable drip for blood pressure such as Cardene - Will need to adjust her medications before she becomes an outpatient - Would keep blood pressure parameters between 130-140/90-100 - Would hold off on narcotics -- Reassess visual fields in the AM. If there is continued visual field deficit, would obtain a repeat MRI brain   I have seen and examined the patient. I have formulated the assessment and recommendations above.  Electronically signed: Dr. Caryl Pina

## 2018-08-02 NOTE — H&P (Addendum)
History and Physical    Cassandra Gould DOB: [birth year] DOA: 08/01/2018  PCP: Clent Demark, PA-C  Patient coming from: Home.  Chief Complaint: Headache.  HPI: Cassandra Gould is a 62 y.o. female with history of diabetes mellitus type 2, hypertension, hyperlipidemia presents to the ER yesterday morning with complaints having headache.  Patient drove herself to the ER.  Patient headache has been present for last 2 days mostly in the frontal throbbing in nature with no associated weakness of the extremities.  Has been having some photophobia and irritation to voice.  Denies any neck pain.  Denies any incontinence of urine or bowel.  Has not lost consciousness.  Denies any recent travel or sick contacts.  ED Course: In the ER patient had CT head followed by MRI of the brain which did not show anything acute.  Sed rate is 52.  Patient was given Compazine Benadryl followed by Reglan and Benadryl and also was given Decadron Depakote following which patient has per the ER patient has some dystonic reaction and became more confused.  As per the report patient also was given Haldol.  Given the symptoms patient has been admitted for further management.  Patient remained afebrile UA is also any definite signs of infection.  On exam patient has become more alert awake and complains of 10 x 10 headache still in the frontal area.  No neck rigidity.  Has significant photophobia.  Glucose is 326.  WBC count is 11.3.  Anion gap is 13.  Hemoglobin 10.6.  Review of Systems: As per HPI, rest all negative.   Past Medical History:  Diagnosis Date  . Asthma    bronchites  . Bronchitis   . Diabetes mellitus   . Headache   . Hypertension     Past Surgical History:  Procedure Laterality Date  . LAPAROSCOPIC APPENDECTOMY N/A 08/31/2016   Procedure: APPENDECTOMY LAPAROSCOPIC;  Surgeon: [name], MD;  Location: [facility];  Service: General;  Laterality: N/A;  . TUBAL LIGATION       reports that she has quit smoking. She has never used smokeless tobacco. She reports that she does not drink alcohol or use drugs.  Allergies  Allergen Reactions  . Oxycodone Itching, Swelling and Rash    Family History  Problem Relation Age of Onset  . Hypertension Mother   . Diabetes Mother   . Diabetes Sister     Prior to Admission medications   Medication Sig Start Date End Date Taking? Authorizing Provider  blood glucose meter kit and supplies KIT Dispense based on patient and insurance preference. Use up to four times daily as directed. (FOR ICD-9 250.00, 250.01). 12/26/16  Yes Clent Demark, PA-C  gabapentin (NEURONTIN) 300 MG capsule Take 2 capsules (600 mg total) by mouth 3 (three) times daily. 07/27/18  Yes Clent Demark, PA-C  glimepiride (AMARYL) 4 MG tablet Take 4 mg by mouth daily with breakfast.   Yes [provider]  lisinopril-hydrochlorothiazide (ZESTORETIC) 20-12.5 MG tablet Take 2 tablets by mouth daily. 07/27/18  Yes Clent Demark, PA-C  metFORMIN (GLUCOPHAGE) 1000 MG tablet TAKE 1 TABLET BY MOUTH TWICE DAILY WITH MEALS Patient taking differently: Take 500 mg by mouth 2 (two) times daily with a meal.  07/27/18  Yes Clent Demark, PA-C  simvastatin (ZOCOR) 40 MG tablet Take 40 mg by mouth daily.   Yes [provider]  amoxicillin-clavulanate (AUGMENTIN) 875-125 MG tablet Take 1 tablet by mouth 2 (two) times daily. One  po bid x 7 days 08/01/18   Deno Etienne, DO  atorvastatin (LIPITOR) 40 MG tablet Take 1 tablet (40 mg total) by mouth daily. Patient not taking: Reported on 08/01/2018 07/27/18   Clent Demark, PA-C  Insulin Detemir (LEVEMIR FLEXTOUCH) 100 UNIT/ML Pen Inject 35 Units into the skin daily at 10 pm. 07/27/18   Clent Demark, PA-C  Insulin Pen Needle (BD PEN NEEDLE MICRO U/F) 32G X 6 MM MISC 1 each by Does not apply route at bedtime. 07/27/18   Clent Demark, PA-C  neomycin-bacitracin-polymyxin (NEOSPORIN)  ointment Apply 1 application topically every 12 (twelve) hours. Patient not taking: Reported on 08/01/2018 07/27/18   Clent Demark, PA-C  sitaGLIPtin (JANUVIA) 100 MG tablet Take 1 tablet (100 mg total) by mouth daily. 07/27/18   Clent Demark, PA-C    Physical Exam: Vitals:   08/01/18 2011 08/01/18 2218 08/01/18 2230 08/02/18 0009  BP: (!) 144/82 (!) 163/73 (!) 165/83 93/76  Pulse: (!) 110 (!) 105 (!) 102 (!) 107  Resp: 20 20 (!) 22   Temp:    99.4 F (37.4 C)  TempSrc:    Oral  SpO2: 97% 99% 98% 98%  Height:    '5\' 10"'  (1.778 m)      Constitutional: Moderately built and nourished. Vitals:   08/01/18 2011 08/01/18 2218 08/01/18 2230 08/02/18 0009  BP: (!) 144/82 (!) 163/73 (!) 165/83 93/76  Pulse: (!) 110 (!) 105 (!) 102 (!) 107  Resp: 20 20 (!) 22   Temp:    99.4 F (37.4 C)  TempSrc:    Oral  SpO2: 97% 99% 98% 98%  Height:    '5\' 10"'  (1.778 m)   Eyes: Anicteric no pallor. ENMT: No discharge from the ears eyes nose or mouth. Neck: No mass felt.  No neck rigidity but no JVD appreciated. Respiratory: No rhonchi or crepitations. Cardiovascular: S1-S2 heard no murmurs appreciated. Abdomen: Soft nontender bowel sounds present. Musculoskeletal: No edema.  No joint effusion. Skin: No rash. Neurologic: Patient is alert awake but at times gets confused moves all extremities 5 x 5.  No facial asymmetry.  Patient has significant photophobia but her pupils are reacting to light. Psychiatric: Mildly confused.   Labs on Admission: I have personally reviewed following labs and imaging studies  CBC: Recent Labs  Lab 08/01/18 2044  WBC 11.3*  HGB 10.6*  HCT 34.8*  MCV 75.7*  PLT 938   Basic Metabolic Panel: Recent Labs  Lab 07/27/18 1204 08/01/18 2044  NA 142 135  K 3.6 3.9  CL 101 98  CO2 24 24  GLUCOSE 187* 326*  BUN 10 10  CREATININE 0.88 0.92  CALCIUM 9.8 9.3   GFR: Estimated Creatinine Clearance: 86.6 mL/min (by C-G formula based on SCr of 0.92  mg/dL). Liver Function Tests: Recent Labs  Lab 07/27/18 1204  AST 12  ALT 15  ALKPHOS 141*  BILITOT 0.3  PROT 7.5  ALBUMIN 4.4   No results for input(s): LIPASE, AMYLASE in the last 168 hours. No results for input(s): AMMONIA in the last 168 hours. Coagulation Profile: No results for input(s): INR, PROTIME in the last 168 hours. Cardiac Enzymes: No results for input(s): CKTOTAL, CKMB, CKMBINDEX, TROPONINI in the last 168 hours. BNP (last 3 results) No results for input(s): PROBNP in the last 8760 hours. HbA1C: No results for input(s): HGBA1C in the last 72 hours. CBG: Recent Labs  Lab 08/01/18 1242 08/01/18 1736 08/01/18 2028  GLUCAP 288* 331* 288*  Lipid Profile: No results for input(s): CHOL, HDL, LDLCALC, TRIG, CHOLHDL, LDLDIRECT in the last 72 hours. Thyroid Function Tests: No results for input(s): TSH, T4TOTAL, FREET4, T3FREE, THYROIDAB in the last 72 hours. Anemia Panel: No results for input(s): VITAMINB12, FOLATE, FERRITIN, TIBC, IRON, RETICCTPCT in the last 72 hours. Urine analysis:    Component Value Date/Time   COLORURINE STRAW (A) 08/01/2018 2130   APPEARANCEUR CLEAR 08/01/2018 2130   LABSPEC 1.021 08/01/2018 2130   PHURINE 7.0 08/01/2018 2130   GLUCOSEU >=500 (A) 08/01/2018 2130   HGBUR SMALL (A) 08/01/2018 2130   BILIRUBINUR NEGATIVE 08/01/2018 2130   KETONESUR 20 (A) 08/01/2018 2130   PROTEINUR 30 (A) 08/01/2018 2130   UROBILINOGEN 1.0 12/21/2008 1619   NITRITE NEGATIVE 08/01/2018 2130   LEUKOCYTESUR TRACE (A) 08/01/2018 2130   Sepsis Labs: '@LABRCNTIP' (procalcitonin:4,lacticidven:4) )No results found for this or any previous visit (from the past 240 hour(s)).   Radiological Exams on Admission: Ct Head Wo Contrast  Result Date: 08/01/2018 CLINICAL DATA:  Frontal headache, vomiting EXAM: CT HEAD WITHOUT CONTRAST TECHNIQUE: Contiguous axial images were obtained from the base of the skull through the vertex without intravenous contrast.  COMPARISON:  None. FINDINGS: Brain: No evidence of acute infarction, hemorrhage, hydrocephalus, extra-axial collection or mass lesion/mass effect. Mild subcortical white matter and periventricular small vessel ischemic changes. Vascular: No hyperdense vessel or unexpected calcification. Skull: Normal. Negative for fracture or focal lesion. Sinuses/Orbits: The visualized paranasal sinuses are essentially clear. The mastoid air cells are unopacified. Other: None. IMPRESSION: No evidence of acute intracranial abnormality. Mild small vessel ischemic changes. Electronically Signed   By: Julian Hy M.D.   On: 08/01/2018 10:57   Mr Brain Wo Contrast  Result Date: 08/01/2018 CLINICAL DATA:  62 year old female with frontal headache since yesterday. Worst headache of life. EXAM: MRI HEAD WITHOUT CONTRAST TECHNIQUE: Multiplanar, multiecho pulse sequences of the brain and surrounding structures were obtained without intravenous contrast. COMPARISON:  Head CT earlier today. FINDINGS: The examination had to be discontinued prior to completion by patient request. Axial T1 and coronal T2 weighted images were not obtained. Brain: No restricted diffusion to suggest acute infarction. No midline shift, mass effect, evidence of mass lesion, ventriculomegaly, extra-axial collection or acute intracranial hemorrhage. Cervicomedullary junction and pituitary are within normal limits. No chronic cerebral blood products identified. Scattered and patchy bilateral cerebral white matter T2 and FLAIR hyperintensity. No cortical encephalomalacia. The deep gray matter nuclei, brainstem, and cerebellum appear within normal limits. Vascular: Major intracranial vascular flow voids are preserved. Skull and upper cervical spine: Partially visible cervical ACDF. Normal bone marrow signal. Sinuses/Orbits: Mildly Disconjugate gaze but otherwise negative orbits. Paranasal sinuses and mastoids are stable and well pneumatized. Other: Visualized  scalp soft tissues are within normal limits. IMPRESSION: 1. The majority of the study was performed but the examination was discontinued prior to completion by patient request. 2.  No acute intracranial abnormality identified. 3. Moderate for age cerebral white matter signal changes are nonspecific but most commonly due to chronic small vessel disease. Electronically Signed   By: Genevie Ann M.D.   On: 08/01/2018 15:43     Assessment/Plan Principal Problem:   Intractable headache Active Problems:   Hypertension   Type 2 diabetes mellitus without complication, with long-term current use of insulin (HCC)   Asthma   Weakness   Headache    1. Intractable headache with mild encephalopathy -discussed with on-call neurologist Dr. Leonel Ramsay.  Neurologist Dr. Leonel Ramsay advised to get lumbar puncture.  Lumbar puncture was  attempted twice in the ER but was not successful.  Will get endoscopy guided lumbar puncture.  Patient empirically started on antibiotics for sepsis including vancomycin ceftriaxone ampicillin and acyclovir.  Sed rate is mildly elevated and discussed with Dr. Leonel Ramsay I will advised to get lumbar puncture first.  CT angiogram of the head and neck has been ordered.  Given the drowsiness will avoid narcotics.  Toradol as needed for pain. 2. Hypertension we will keep patient on PRN IV hydralazine for now since patient is mildly drowsy may not be able to take orally reliably. 3. Diabetes mellitus type 2 -for now I have kept patient on sliding scale coverage.  Patient's medication list states patient is on Levemir but patient states he does not take that.  Patient's gabapentin is on hold which may be restarted once patient is more alert awake. 4. Anemia appears to be chronic.  Follow CBC. 5. History of hyperlipidemia and statins which can be started after patient becomes more alert awake.   DVT prophylaxis: SCDs in anticipation of lumbar puncture. Code Status: Full code. Family  Communication: Patient's son and daughter. Disposition Plan: Home. Consults called: Discussed with neurologist. Admission status: Observation.   Rise Patience MD [facility] Pager [phone].  If 7PM-7AM, please contact night-coverage [email or web address] Password [email or web address]  08/02/2018, 12:40 AM

## 2018-08-02 NOTE — Progress Notes (Signed)
Patient refused lumbar puncture. MD paged and aware

## 2018-08-02 NOTE — Progress Notes (Signed)
Pt care was difficult at times due to family prescence. At times there were 5 people in the room. Family had numerous questions, and as I searched through the pt's chart to help answer these questions, there were more questions and noise, making it difficult for me to concentrate. I left the room several times to use outside computers. Physician called to meet with family and discuss pt care. Bjorn Loserhonda, another RN stepped in to answer questions from the family as well.She was very helpful dealing with the family.

## 2018-08-02 NOTE — Progress Notes (Signed)
MD paged due to high blood pressure even after given 10 mg of labetalol. New orders given.

## 2018-08-03 ENCOUNTER — Inpatient Hospital Stay (HOSPITAL_COMMUNITY): Payer: BC Managed Care – PPO

## 2018-08-03 LAB — CBC WITH DIFFERENTIAL/PLATELET
Abs Immature Granulocytes: 0.02 10*3/uL (ref 0.00–0.07)
BASOS ABS: 0 10*3/uL (ref 0.0–0.1)
BASOS PCT: 0 %
EOS ABS: 0 10*3/uL (ref 0.0–0.5)
Eosinophils Relative: 0 %
HCT: 32.6 % — ABNORMAL LOW (ref 36.0–46.0)
Hemoglobin: 10 g/dL — ABNORMAL LOW (ref 12.0–15.0)
IMMATURE GRANULOCYTES: 0 %
Lymphocytes Relative: 27 %
Lymphs Abs: 2.4 10*3/uL (ref 0.7–4.0)
MCH: 23.4 pg — ABNORMAL LOW (ref 26.0–34.0)
MCHC: 30.7 g/dL (ref 30.0–36.0)
MCV: 76.2 fL — ABNORMAL LOW (ref 80.0–100.0)
Monocytes Absolute: 0.5 10*3/uL (ref 0.1–1.0)
Monocytes Relative: 6 %
NEUTROS PCT: 67 %
NRBC: 0 % (ref 0.0–0.2)
Neutro Abs: 5.7 10*3/uL (ref 1.7–7.7)
PLATELETS: 281 10*3/uL (ref 150–400)
RBC: 4.28 MIL/uL (ref 3.87–5.11)
RDW: 13.3 % (ref 11.5–15.5)
WBC: 8.7 10*3/uL (ref 4.0–10.5)

## 2018-08-03 LAB — GLUCOSE, CAPILLARY
GLUCOSE-CAPILLARY: 202 mg/dL — AB (ref 70–99)
GLUCOSE-CAPILLARY: 251 mg/dL — AB (ref 70–99)
Glucose-Capillary: 220 mg/dL — ABNORMAL HIGH (ref 70–99)
Glucose-Capillary: 232 mg/dL — ABNORMAL HIGH (ref 70–99)
Glucose-Capillary: 254 mg/dL — ABNORMAL HIGH (ref 70–99)
Glucose-Capillary: 185 mg/dL — ABNORMAL HIGH (ref 70–99)

## 2018-08-03 LAB — BASIC METABOLIC PANEL
ANION GAP: 9 (ref 5–15)
BUN: 7 mg/dL — AB (ref 8–23)
CO2: 26 mmol/L (ref 22–32)
Calcium: 8.8 mg/dL — ABNORMAL LOW (ref 8.9–10.3)
Chloride: 101 mmol/L (ref 98–111)
Creatinine, Ser: 0.95 mg/dL (ref 0.44–1.00)
GLUCOSE: 223 mg/dL — AB (ref 70–99)
Potassium: 3.1 mmol/L — ABNORMAL LOW (ref 3.5–5.1)
Sodium: 136 mmol/L (ref 135–145)

## 2018-08-03 MED ORDER — LISINOPRIL-HYDROCHLOROTHIAZIDE 20-12.5 MG PO TABS: 2.0000 | ORAL_TABLET | Freq: Every day | ORAL | Status: DC

## 2018-08-03 MED ORDER — INSULIN ASPART 100 UNIT/ML ~~LOC~~ SOLN
0.0000 [IU] | Freq: Three times a day (TID) | SUBCUTANEOUS | Status: DC
  Administered 2018-08-04: 5 [IU] via SUBCUTANEOUS
  Administered 2018-08-04 (×2): 3 [IU] via SUBCUTANEOUS
  Administered 2018-08-05: 5 [IU] via SUBCUTANEOUS
  Administered 2018-08-05: 3 [IU] via SUBCUTANEOUS

## 2018-08-03 MED ORDER — MAGNESIUM SULFATE 50 % IJ SOLN: 1.0000 g | Freq: Once | INTRAMUSCULAR | Status: DC

## 2018-08-03 MED ORDER — GADOBUTROL 1 MMOL/ML IV SOLN
10.0000 mL | Freq: Once | INTRAVENOUS | Status: AC | PRN
  Administered 2018-08-03: 10 mL via INTRAVENOUS

## 2018-08-03 MED ORDER — LISINOPRIL 10 MG PO TABS
20.0000 mg | ORAL_TABLET | Freq: Every day | ORAL | Status: DC
  Administered 2018-08-03: 20 mg via ORAL
  Filled 2018-08-03: qty 2

## 2018-08-03 MED ORDER — INSULIN DETEMIR 100 UNIT/ML ~~LOC~~ SOLN
15.0000 [IU] | Freq: Every day | SUBCUTANEOUS | Status: DC
  Administered 2018-08-03: 15 [IU] via SUBCUTANEOUS
  Filled 2018-08-03: qty 0.15

## 2018-08-03 MED ORDER — HYDROCHLOROTHIAZIDE 12.5 MG PO CAPS
12.5000 mg | ORAL_CAPSULE | Freq: Every day | ORAL | Status: DC
  Administered 2018-08-03: 12.5 mg via ORAL
  Filled 2018-08-03: qty 1

## 2018-08-03 MED ORDER — MAGNESIUM SULFATE IN D5W 1-5 GM/100ML-% IV SOLN
1.0000 g | Freq: Once | INTRAVENOUS | Status: AC
  Administered 2018-08-03: 1 g via INTRAVENOUS
  Filled 2018-08-03: qty 100

## 2018-08-03 MED ORDER — POTASSIUM CHLORIDE CRYS ER 20 MEQ PO TBCR
40.0000 meq | EXTENDED_RELEASE_TABLET | ORAL | Status: AC
  Administered 2018-08-03 (×2): 40 meq via ORAL
  Filled 2018-08-03 (×2): qty 2

## 2018-08-03 MED ORDER — HYDROCHLOROTHIAZIDE 12.5 MG PO CAPS: 12.5000 mg | ORAL_CAPSULE | Freq: Once | ORAL | Status: DC

## 2018-08-03 MED ORDER — LISINOPRIL 10 MG PO TABS
40.0000 mg | ORAL_TABLET | Freq: Every day | ORAL | Status: DC
  Administered 2018-08-04 – 2018-08-05 (×2): 40 mg via ORAL
  Filled 2018-08-03 (×2): qty 4

## 2018-08-03 MED ORDER — HYDROCHLOROTHIAZIDE 25 MG PO TABS
25.0000 mg | ORAL_TABLET | Freq: Every day | ORAL | Status: DC
  Administered 2018-08-04 – 2018-08-05 (×2): 25 mg via ORAL
  Filled 2018-08-03 (×2): qty 1

## 2018-08-03 MED ORDER — LISINOPRIL 10 MG PO TABS: 20.0000 mg | ORAL_TABLET | Freq: Once | ORAL | Status: DC

## 2018-08-03 NOTE — Plan of Care (Signed)
  Problem: Safety: Goal: Ability to remain free from injury will improve Outcome: Progressing   

## 2018-08-03 NOTE — Progress Notes (Addendum)
Subjective: Patient still complains of minor headache.  Daughters in the room stating that she still has difficulty with her vision however when I walked in the room patient was looking at her iPhone and reading her iPhone without any difficulty.  When asked how her vision is, the patient could not give me a good description.  She stated that it came and went, at times she saw what seemed to be hallucinations, including 3-dimensional shapes such as blocks and triangles. .  Daughter is in the room and her main concern at this point in time is her mother's vision. 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 She is focusing on this.  Exam: Vitals:   08/03/18 1000 08/03/18 1100  BP: (!) 132/52 (!) 122/57  Pulse: (!) 101 (!) 114  Resp: (!) 25 17  Temp:    SpO2: 96% 94%    Physical Exam  HEENT-  Normocephalic, no lesions, without obvious abnormality.  Normal external eye and conjunctiva.   Extremities- Warm, dry and intact Musculoskeletal-no joint tenderness, deformity or swelling Skin-warm and dry, no hyperpigmentation, vitiligo, or suspicious lesions  Neuro:  Mental Status: Alert, oriented, thought content appropriate.  Speech fluent without evidence of aphasia.  Able to follow 3 step commands without difficulty. Cranial Nerves: II: When I walked in the room patient was easily tracking me throughout the room.  When I started to do the exam, and asked her to count my fingers she purposefully would look the opposite direction or down stating she could not count my fingers.  However, when distracted she was able to count my fingers in all 4 quadrants.  When I went to cover each eye she quickly jerked backwards as if I was going to touch her eye.  Whenever asked patient to name what I was holding patient would look with her eyes in the opposite direction; however, when she brought her eyes back to  midline she was able to name the objects. III,IV, VI: ptosis not present, EOM as described above. PERRL. V,VII: smile symmetric, facial light touch sensation normal bilaterally VIII: hearing normal bilaterally IX,X: uvula rises midline XI: bilateral shoulder shrug XII: midline tongue extension Motor: Right : Upper extremity   5/5    Left:     Upper extremity   5/5  Lower extremity   5/5     Lower extremity   5/5 Tone and bulk:normal tone throughout; no atrophy noted Sensory: Pinprick and light touch intact throughout, bilaterally Deep Tendon Reflexes: 2+ and symmetric throughout Plantars: Right: downgoing   Left: downgoing Cerebellar: normal finger-to-nose, normal rapid alternating movements and normal heel-to-shin test bilaterally    Medications:  Scheduled: . amLODipine  10 mg Oral Daily  . Chlorhexidine Gluconate Cloth  6 each Topical Q0600  . lisinopril  20 mg Oral Daily   And  . hydrochlorothiazide  12.5 mg Oral Daily  . insulin aspart  0-9 Units Subcutaneous Q4H  . mupirocin ointment  1 application Nasal BID  . potassium chloride  40 mEq Oral Q4H   Continuous: . magnesium sulfate 1 - 4 g bolus IVPB    . niCARDipine 7.5 mg/hr (08/03/18 0703)    Pertinent Labs/Diagnostics:   Ct Angio Head W Or Wo Contrast  Result Date: 08/02/2018 CLINICAL DATA:  Headaches EXAM: CT ANGIOGRAPHY HEAD AND NECK TECHNIQUE: Multidetector CT imaging of the head and neck was performed using the standard protocol during  bolus administration of intravenous contrast. Multiplanar CT image reconstructions and MIPs were obtained to evaluate the vascular anatomy. Carotid stenosis measurements (when applicable) are obtained utilizing NASCET criteria, using the distal internal carotid diameter as the denominator. CONTRAST:  ISOVUE-370 IOPAMIDOL (ISOVUE-370) INJECTION 76% COMPARISON:  Brain MRI 08/01/2018 and head CT 08/01/2018 FINDINGS: CTA NECK FINDINGS SKELETON: There is no bony spinal canal  stenosis. No lytic or blastic lesion. OTHER NECK: Normal pharynx, larynx and major salivary glands. No cervical lymphadenopathy. Unremarkable thyroid gland. UPPER CHEST: No pneumothorax or pleural effusion. No nodules or masses. AORTIC ARCH: There is mild calcific atherosclerosis of the aortic arch. There is no aneurysm, dissection or hemodynamically significant stenosis of the visualized ascending aorta and aortic arch. Normal variant aortic arch branching pattern with the brachiocephalic and left common carotid arteries sharing a common origin. The visualized proximal subclavian arteries are widely patent. RIGHT CAROTID SYSTEM: --Common carotid artery: Widely patent origin without common carotid artery dissection or aneurysm. --Internal carotid artery: Normal without aneurysm, dissection or stenosis. --External carotid artery: No acute abnormality. LEFT CAROTID SYSTEM: --Common carotid artery: Widely patent origin without common carotid artery dissection or aneurysm. --Internal carotid artery: Normal without aneurysm, dissection or stenosis. --External carotid artery: No acute abnormality. VERTEBRAL ARTERIES: Right dominant configuration. Both origins are normal. No dissection, occlusion or flow-limiting stenosis to the vertebrobasilar confluence. CTA HEAD FINDINGS ANTERIOR CIRCULATION: --Intracranial internal carotid arteries: Normal. --Anterior cerebral arteries: Normal. Both A1 segments are present. Patent anterior communicating artery. --Middle cerebral arteries: Normal. --Posterior communicating arteries: Absent bilaterally. POSTERIOR CIRCULATION: --Basilar artery: Normal. --Posterior cerebral arteries: Normal. --Superior cerebellar arteries: Normal. --Inferior cerebellar arteries: Normal anterior and posterior inferior cerebellar arteries. VENOUS SINUSES: As permitted by contrast timing, patent. ANATOMIC VARIANTS: None DELAYED PHASE: No parenchymal contrast enhancement. Review of the MIP images confirms the  above findings. IMPRESSION: Normal CTA of the head and neck. Electronically Signed   By: Deatra Robinson M.D.   On: 08/02/2018 01:39   Ct Angio Neck W And/or Wo Contrast  Result Date: 08/02/2018 CLINICAL DATA:  Headaches EXAM: CT ANGIOGRAPHY HEAD AND NECK TECHNIQUE: Multidetector CT imaging of the head and neck was performed using the standard protocol during bolus administration of intravenous contrast. Multiplanar CT image reconstructions and MIPs were obtained to evaluate the vascular anatomy. Carotid stenosis measurements (when applicable) are obtained utilizing NASCET criteria, using the distal internal carotid diameter as the denominator. CONTRAST:  ISOVUE-370 IOPAMIDOL (ISOVUE-370) INJECTION 76% COMPARISON:  Brain MRI 08/01/2018 and head CT 08/01/2018 FINDINGS: CTA NECK FINDINGS SKELETON: There is no bony spinal canal stenosis. No lytic or blastic lesion. OTHER NECK: Normal pharynx, larynx and major salivary glands. No cervical lymphadenopathy. Unremarkable thyroid gland. UPPER CHEST: No pneumothorax or pleural effusion. No nodules or masses. AORTIC ARCH: There is mild calcific atherosclerosis of the aortic arch. There is no aneurysm, dissection or hemodynamically significant stenosis of the visualized ascending aorta and aortic arch. Normal variant aortic arch branching pattern with the brachiocephalic and left common carotid arteries sharing a common origin. The visualized proximal subclavian arteries are widely patent. RIGHT CAROTID SYSTEM: --Common carotid artery: Widely patent origin without common carotid artery dissection or aneurysm. --Internal carotid artery: Normal without aneurysm, dissection or stenosis. --External carotid artery: No acute abnormality. LEFT CAROTID SYSTEM: --Common carotid artery: Widely patent origin without common carotid artery dissection or aneurysm. --Internal carotid artery: Normal without aneurysm, dissection or stenosis. --External carotid artery: No acute  abnormality. VERTEBRAL ARTERIES: Right dominant configuration. Both origins are normal. No  dissection, occlusion or flow-limiting stenosis to the vertebrobasilar confluence. CTA HEAD FINDINGS ANTERIOR CIRCULATION: --Intracranial internal carotid arteries: Normal. --Anterior cerebral arteries: Normal. Both A1 segments are present. Patent anterior communicating artery. --Middle cerebral arteries: Normal. --Posterior communicating arteries: Absent bilaterally. POSTERIOR CIRCULATION: --Basilar artery: Normal. --Posterior cerebral arteries: Normal. --Superior cerebellar arteries: Normal. --Inferior cerebellar arteries: Normal anterior and posterior inferior cerebellar arteries. VENOUS SINUSES: As permitted by contrast timing, patent. ANATOMIC VARIANTS: None DELAYED PHASE: No parenchymal contrast enhancement. Review of the MIP images confirms the above findings. IMPRESSION: Normal CTA of the head and neck. Electronically Signed   By: Deatra Robinson M.D.   On: 08/02/2018 01:39   Mr Brain Wo Contrast  Result Date: 08/01/2018 CLINICAL DATA:  62 year old female with frontal headache since yesterday. Worst headache of life. EXAM: MRI HEAD WITHOUT CONTRAST TECHNIQUE: Multiplanar, multiecho pulse sequences of the brain and surrounding structures were obtained without intravenous contrast. COMPARISON:  Head CT earlier today. FINDINGS: The examination had to be discontinued prior to completion by patient request. Axial T1 and coronal T2 weighted images were not obtained. Brain: No restricted diffusion to suggest acute infarction. No midline shift, mass effect, evidence of mass lesion, ventriculomegaly, extra-axial collection or acute intracranial hemorrhage. Cervicomedullary junction and pituitary are within normal limits. No chronic cerebral blood products identified. Scattered and patchy bilateral cerebral white matter T2 and FLAIR hyperintensity. No cortical encephalomalacia. The deep gray matter nuclei, brainstem, and  cerebellum appear within normal limits. Vascular: Major intracranial vascular flow voids are preserved. Skull and upper cervical spine: Partially visible cervical ACDF. Normal bone marrow signal. Sinuses/Orbits: Mildly Disconjugate gaze but otherwise negative orbits. Paranasal sinuses and mastoids are stable and well pneumatized. Other: Visualized scalp soft tissues are within normal limits. IMPRESSION: 1. The majority of the study was performed but the examination was discontinued prior to completion by patient request. 2.  No acute intracranial abnormality identified. 3. Moderate for age cerebral white matter signal changes are nonspecific but most commonly due to chronic small vessel disease. Electronically Signed   By: Odessa Fleming M.D.   On: 08/01/2018 15:43   Dg Chest Port 1 View  Result Date: 08/02/2018 CLINICAL DATA:  Headache. History of asthmatic bronchitis and diabetes. No prior chest complaints. Former smoker. EXAM: PORTABLE CHEST 1 VIEW COMPARISON:  PA and lateral chest x-ray of August 30, 2016 FINDINGS: The lungs are adequately inflated. There is no focal infiltrate. The cardiac silhouette is mildly enlarged. The central pulmonary vascularity is prominent but stable allowing for the portable technique. There is calcification in the wall of the aortic arch. There is no pleural effusion. IMPRESSION: Stable top-normal cardiac size with central pulmonary vascular prominence. No alveolar pneumonia nor definite pulmonary edema. Electronically Signed   By: David  [place] M.D.   On: 08/02/2018 07:37     Felicie Morn PA-C [facility] [phone]   Assessment: 62 year old female who presented to the hospital secondary to headache which had resolved as blood pressure has been lowered. Headache recurred this morning despite unremarkable BPs.  1. Apparently per daughter after she received some of the migraine headache medications she has been having difficulty with her vision.  During both the  initial consultation and also follow-up, her neurological exam and history have been inconsistent/fluctuating.  When patient is asked to count fingers to look at objects as noted above she looks to the opposite direction and says she cannot see it.  However, as noted above patient was looking at her iPhone and reading her iPhone when  I walked in. Yesterday, exam findings were consistent with a homonymous left hemianopsia despite a normal MRI brain previously.  2. To confirm neurological findings are not consistent with an occipital lobe stroke occurring after the initial MRI, a repeat MRI is being obtained with and without contrast. An ophthalmological evaluation will be indicated if repeat MRI is again negative.  3. DDx includes new stroke, intrinsic eye disease and possible malingering   Recommendations: -We will obtain MRI brain without contrast to evaluate for pres, and/or possible occipital strokes however I feel this is less likely. - If MRI is negative the neurology team has explained to the family that she will need to be evaluated by an phthalmologist  - Patient needs to follow-up with PCP as soon as possible to get her blood pressure down as when she is off the nicardipine her blood pressure systolically goes back up to the 160s - 170s. -If MRI is negative neurology will sign off   Electronically signed: Dr. Caryl Pina 08/03/2018, 12:39 PM

## 2018-08-03 NOTE — Progress Notes (Signed)
Inpatient Diabetes Program Recommendations  AACE/ADA: New Consensus Statement on Inpatient Glycemic Control (2015)  Target Ranges:  Prepandial:   less than 140 mg/dL      Peak postprandial:   less than 180 mg/dL (1-2 hours)      Critically ill patients:  140 - 180 mg/dL   Lab Results  Component Value Date   GLUCAP 232 (H) 08/03/2018   HGBA1C 9.9 (A) 07/27/2018    Review of Glycemic Control  Diabetes history: DM2 Outpatient Diabetes medications: metformin 500 mg bid, Levemir 35 units (has not started taking), Amaryl 4 mg QD, Januvia Current orders for Inpatient glycemic control: Novolog 0-9 units Q4H  Eating 100%. Will need basal insulin. HgbA1C - 9.9% - needs tighter glucose control  Inpatient Diabetes Program Recommendations:     Levemir 20 units QHS Increase Novolog to 0-15 units tidwc and hs  Will see pt in am regarding her HgbA1C of 9.9%.  Will follow.  Thank you. Ailene Ardshonda Rexann Lueras, RD, LDN, CDE Inpatient Diabetes Coordinator [phone]

## 2018-08-03 NOTE — Progress Notes (Signed)
PROGRESS NOTE    Linde GillisShirley Riggi  QIO:[id number] DOB: 04/17/1956 DOA: 08/01/2018 PCP: Loletta SpecterGomez, Roger David, PA-C   Brief Narrative: Patient is a 62 year old female with past medical history of diabetes type 2, hypertension, hyperlipidemia who presented to the emergency department with complaints of intractable headache.  Headache was mainly in the frontal region.  She was also found to be hypertensive on presentation. She was also started on broad-spectrum antibiotics to cover for possible encephalitis/meningitis.  Lumbar puncture was attempted in the emergency department but was not successful. She later denied LP.  Neurology consulted.  Suspected that her headache is most likely associated  with hypertension.  Assessment & Plan:   Principal Problem:   Intractable headache Active Problems:   Hypertension   Type 2 diabetes mellitus without complication, with long-term current use of insulin (HCC)   Asthma   Weakness   Headache  Intractable headache: Complains of severe frontal headache on presentation.  Suspected to be from hypertension.  Headache actually improved with improvement in the blood pressure.  This morning her headache was significantly better but later on she started having headache again during the afternoon.  Neurology following.  She was also complaining of some blurry vision so planning for MRI to rule out PRES. Migraine is  also a possibility. Neurology has advised for MRI of the brain today.  Hypertension: Noted to be in hypertensive urgency presentation.  Also started on nicardipine drip which has improved blood pressure.  Her headache improved with lowering of the blood pressure.  She was on hydrochlorothiazide and lisinopril at home. We have added amlodipine.  She needs to follow-up with her PCP closely regarding the monitoring of her blood pressure.  Diabetes mellitus type 2: Continue sliding scale insulin for now.  Continue her home medications on discharge.  Anemia:  Chronic.  Currently stable.  Hyperlipidemia: On statin at home.  Hypokalemia: Supplemented with potassium  DVT prophylaxis:SCD Code Status: Full Family Communication: daughter present at the bed side Disposition Plan: Home after resolution of headache   Consultants: Neurology  Procedures: None  Antimicrobials: None  Subjective: Patient seen and examined the bedside this morning.  Blood pressure had improved.  During my evaluation this morning, her headache was significantly better.  She rated the headache as 4/10.  Later in the afternoon I was notified that she again had severe headache.  Waiting for MRI  Objective: Vitals:   08/03/18 1000 08/03/18 1100 08/03/18 1200 08/03/18 1300  BP: (!) 132/52 (!) 122/57 127/83 (!) 161/74  Pulse: (!) 101 (!) 114 99 97  Resp: (!) 25 17 16  (!) 25  Temp:      TempSrc:      SpO2: 96% 94% 99% 98%  Height:        Intake/Output Summary (Last 24 hours) at 08/03/2018 1422 Last data filed at 08/03/2018 1300 Gross per 24 hour  Intake 3184.72 ml  Output 750 ml  Net 2434.72 ml   There were no vitals filed for this visit.  Examination:  General exam: Appears calm and comfortable ,Not in distress,average built HEENT:PERRL,Oral mucosa moist, Ear/Nose normal on gross exam Respiratory system: Bilateral equal air entry, normal vesicular breath sounds, no wheezes or crackles  Cardiovascular system: S1 & S2 heard, RRR. No JVD, murmurs, rubs, gallops or clicks. No pedal edema. Gastrointestinal system: Abdomen is nondistended, soft and nontender. No organomegaly or masses felt. Normal bowel sounds heard. Central nervous system: Alert and oriented. No focal neurological deficits. Extremities: No edema, no clubbing ,no cyanosis,  distal peripheral pulses palpable. Skin: No rashes, lesions or ulcers,no icterus ,no pallor MSK: Normal muscle bulk,tone ,power Psychiatry: Judgement and insight appear normal. Mood & affect appropriate.     Data Reviewed:  I have personally reviewed following labs and imaging studies  CBC: Recent Labs  Lab 08/01/18 2044 08/03/18 0321  WBC 11.3* 8.7  NEUTROABS  --  5.7  HGB 10.6* 10.0*  HCT 34.8* 32.6*  MCV 75.7* 76.2*  PLT 311 281   Basic Metabolic Panel: Recent Labs  Lab 08/01/18 2044 08/03/18 0321  NA 135 136  K 3.9 3.1*  CL 98 101  CO2 24 26  GLUCOSE 326* 223*  BUN 10 7*  CREATININE 0.92 0.95  CALCIUM 9.3 8.8*   GFR: Estimated Creatinine Clearance: 83.8 mL/min (by C-G formula based on SCr of 0.95 mg/dL). Liver Function Tests: No results for input(s): AST, ALT, ALKPHOS, BILITOT, PROT, ALBUMIN in the last 168 hours. No results for input(s): LIPASE, AMYLASE in the last 168 hours. Recent Labs  Lab 08/02/18 0739  AMMONIA 29   Coagulation Profile: No results for input(s): INR, PROTIME in the last 168 hours. Cardiac Enzymes: No results for input(s): CKTOTAL, CKMB, CKMBINDEX, TROPONINI in the last 168 hours. BNP (last 3 results) No results for input(s): PROBNP in the last 8760 hours. HbA1C: No results for input(s): HGBA1C in the last 72 hours. CBG: Recent Labs  Lab 08/02/18 1932 08/02/18 2321 08/03/18 0312 08/03/18 0752 08/03/18 1323  GLUCAP 275* 223* 220* 202* 251*   Lipid Profile: No results for input(s): CHOL, HDL, LDLCALC, TRIG, CHOLHDL, LDLDIRECT in the last 72 hours. Thyroid Function Tests: No results for input(s): TSH, T4TOTAL, FREET4, T3FREE, THYROIDAB in the last 72 hours. Anemia Panel: No results for input(s): VITAMINB12, FOLATE, FERRITIN, TIBC, IRON, RETICCTPCT in the last 72 hours. Sepsis Labs: No results for input(s): PROCALCITON, LATICACIDVEN in the last 168 hours.  Recent Results (from the past 240 hour(s))  MRSA PCR Screening     Status: Abnormal   Collection Time: 08/02/18 12:05 AM  Result Value Ref Range Status   MRSA by PCR POSITIVE (A) NEGATIVE Final    Comment:        The GeneXpert MRSA Assay (FDA approved for NASAL specimens only), is one  component of a comprehensive MRSA colonization surveillance program. It is not intended to diagnose MRSA infection nor to guide or monitor treatment for MRSA infections. RESULT CALLED TO, READ BACK BY AND VERIFIED WITH: PATRAM,L AT 0147 ON 08/02/2018 BY MOSLEY,J Performed at [facility], [address], [place], [place] [address]          Radiology Studies: Ct Angio Head W Or Wo Contrast  Result Date: 08/02/2018 CLINICAL DATA:  Headaches EXAM: CT ANGIOGRAPHY HEAD AND NECK TECHNIQUE: Multidetector CT imaging of the head and neck was performed using the standard protocol during bolus administration of intravenous contrast. Multiplanar CT image reconstructions and MIPs were obtained to evaluate the vascular anatomy. Carotid stenosis measurements (when applicable) are obtained utilizing NASCET criteria, using the distal internal carotid diameter as the denominator. CONTRAST:  ISOVUE-370 IOPAMIDOL (ISOVUE-370) INJECTION 76% COMPARISON:  Brain MRI 08/01/2018 and head CT 08/01/2018 FINDINGS: CTA NECK FINDINGS SKELETON: There is no bony spinal canal stenosis. No lytic or blastic lesion. OTHER NECK: Normal pharynx, larynx and major salivary glands. No cervical lymphadenopathy. Unremarkable thyroid gland. UPPER CHEST: No pneumothorax or pleural effusion. No nodules or masses. AORTIC ARCH: There is mild calcific atherosclerosis of the aortic arch. There is no  aneurysm, dissection or hemodynamically significant stenosis of the visualized ascending aorta and aortic arch. Normal variant aortic arch branching pattern with the brachiocephalic and left common carotid arteries sharing a common origin. The visualized proximal subclavian arteries are widely patent. RIGHT CAROTID SYSTEM: --Common carotid artery: Widely patent origin without common carotid artery dissection or aneurysm. --Internal carotid artery: Normal without aneurysm, dissection or stenosis. --External carotid artery: No  acute abnormality. LEFT CAROTID SYSTEM: --Common carotid artery: Widely patent origin without common carotid artery dissection or aneurysm. --Internal carotid artery: Normal without aneurysm, dissection or stenosis. --External carotid artery: No acute abnormality. VERTEBRAL ARTERIES: Right dominant configuration. Both origins are normal. No dissection, occlusion or flow-limiting stenosis to the vertebrobasilar confluence. CTA HEAD FINDINGS ANTERIOR CIRCULATION: --Intracranial internal carotid arteries: Normal. --Anterior cerebral arteries: Normal. Both A1 segments are present. Patent anterior communicating artery. --Middle cerebral arteries: Normal. --Posterior communicating arteries: Absent bilaterally. POSTERIOR CIRCULATION: --Basilar artery: Normal. --Posterior cerebral arteries: Normal. --Superior cerebellar arteries: Normal. --Inferior cerebellar arteries: Normal anterior and posterior inferior cerebellar arteries. VENOUS SINUSES: As permitted by contrast timing, patent. ANATOMIC VARIANTS: None DELAYED PHASE: No parenchymal contrast enhancement. Review of the MIP images confirms the above findings. IMPRESSION: Normal CTA of the head and neck. Electronically Signed   By: Deatra Robinson M.D.   On: 08/02/2018 01:39   Ct Angio Neck W And/or Wo Contrast  Result Date: 08/02/2018 CLINICAL DATA:  Headaches EXAM: CT ANGIOGRAPHY HEAD AND NECK TECHNIQUE: Multidetector CT imaging of the head and neck was performed using the standard protocol during bolus administration of intravenous contrast. Multiplanar CT image reconstructions and MIPs were obtained to evaluate the vascular anatomy. Carotid stenosis measurements (when applicable) are obtained utilizing NASCET criteria, using the distal internal carotid diameter as the denominator. CONTRAST:  ISOVUE-370 IOPAMIDOL (ISOVUE-370) INJECTION 76% COMPARISON:  Brain MRI 08/01/2018 and head CT 08/01/2018 FINDINGS: CTA NECK FINDINGS SKELETON: There is no bony spinal  canal stenosis. No lytic or blastic lesion. OTHER NECK: Normal pharynx, larynx and major salivary glands. No cervical lymphadenopathy. Unremarkable thyroid gland. UPPER CHEST: No pneumothorax or pleural effusion. No nodules or masses. AORTIC ARCH: There is mild calcific atherosclerosis of the aortic arch. There is no aneurysm, dissection or hemodynamically significant stenosis of the visualized ascending aorta and aortic arch. Normal variant aortic arch branching pattern with the brachiocephalic and left common carotid arteries sharing a common origin. The visualized proximal subclavian arteries are widely patent. RIGHT CAROTID SYSTEM: --Common carotid artery: Widely patent origin without common carotid artery dissection or aneurysm. --Internal carotid artery: Normal without aneurysm, dissection or stenosis. --External carotid artery: No acute abnormality. LEFT CAROTID SYSTEM: --Common carotid artery: Widely patent origin without common carotid artery dissection or aneurysm. --Internal carotid artery: Normal without aneurysm, dissection or stenosis. --External carotid artery: No acute abnormality. VERTEBRAL ARTERIES: Right dominant configuration. Both origins are normal. No dissection, occlusion or flow-limiting stenosis to the vertebrobasilar confluence. CTA HEAD FINDINGS ANTERIOR CIRCULATION: --Intracranial internal carotid arteries: Normal. --Anterior cerebral arteries: Normal. Both A1 segments are present. Patent anterior communicating artery. --Middle cerebral arteries: Normal. --Posterior communicating arteries: Absent bilaterally. POSTERIOR CIRCULATION: --Basilar artery: Normal. --Posterior cerebral arteries: Normal. --Superior cerebellar arteries: Normal. --Inferior cerebellar arteries: Normal anterior and posterior inferior cerebellar arteries. VENOUS SINUSES: As permitted by contrast timing, patent. ANATOMIC VARIANTS: None DELAYED PHASE: No parenchymal contrast enhancement. Review of the MIP images  confirms the above findings. IMPRESSION: Normal CTA of the head and neck. Electronically Signed   By: Deatra Robinson M.D.   On: 08/02/2018  01:39   Mr Brain 57 Contrast  Result Date: 08/01/2018 CLINICAL DATA:  62 year old female with frontal headache since yesterday. Worst headache of life. EXAM: MRI HEAD WITHOUT CONTRAST TECHNIQUE: Multiplanar, multiecho pulse sequences of the brain and surrounding structures were obtained without intravenous contrast. COMPARISON:  Head CT earlier today. FINDINGS: The examination had to be discontinued prior to completion by patient request. Axial T1 and coronal T2 weighted images were not obtained. Brain: No restricted diffusion to suggest acute infarction. No midline shift, mass effect, evidence of mass lesion, ventriculomegaly, extra-axial collection or acute intracranial hemorrhage. Cervicomedullary junction and pituitary are within normal limits. No chronic cerebral blood products identified. Scattered and patchy bilateral cerebral white matter T2 and FLAIR hyperintensity. No cortical encephalomalacia. The deep gray matter nuclei, brainstem, and cerebellum appear within normal limits. Vascular: Major intracranial vascular flow voids are preserved. Skull and upper cervical spine: Partially visible cervical ACDF. Normal bone marrow signal. Sinuses/Orbits: Mildly Disconjugate gaze but otherwise negative orbits. Paranasal sinuses and mastoids are stable and well pneumatized. Other: Visualized scalp soft tissues are within normal limits. IMPRESSION: 1. The majority of the study was performed but the examination was discontinued prior to completion by patient request. 2.  No acute intracranial abnormality identified. 3. Moderate for age cerebral white matter signal changes are nonspecific but most commonly due to chronic small vessel disease. Electronically Signed   By: Odessa Fleming M.D.   On: 08/01/2018 15:43   Dg Chest Port 1 View  Result Date: 08/02/2018 CLINICAL DATA:   Headache. History of asthmatic bronchitis and diabetes. No prior chest complaints. Former smoker. EXAM: PORTABLE CHEST 1 VIEW COMPARISON:  PA and lateral chest x-ray of August 30, 2016 FINDINGS: The lungs are adequately inflated. There is no focal infiltrate. The cardiac silhouette is mildly enlarged. The central pulmonary vascularity is prominent but stable allowing for the portable technique. There is calcification in the wall of the aortic arch. There is no pleural effusion. IMPRESSION: Stable top-normal cardiac size with central pulmonary vascular prominence. No alveolar pneumonia nor definite pulmonary edema. Electronically Signed   By: David  [place] M.D.   On: 08/02/2018 07:37        Scheduled Meds: . amLODipine  10 mg Oral Daily  . Chlorhexidine Gluconate Cloth  6 each Topical Q0600  . [START ON 08/04/2018] lisinopril  40 mg Oral Daily   And  . [START ON 08/04/2018] hydrochlorothiazide  25 mg Oral Daily  . hydrochlorothiazide  12.5 mg Oral Once  . insulin aspart  0-9 Units Subcutaneous Q4H  . lisinopril  20 mg Oral Once  . mupirocin ointment  1 application Nasal BID   Continuous Infusions: . niCARDipine Stopped (08/03/18 1059)     LOS: 1 day    Time spent:35 mins. More than 50% of that time was spent in counseling and/or coordination of care.      Burnadette Pop, MD [facility] Pager [phone]  If 7PM-7AM, please contact night-coverage [email or web address] Password [email or web address] 08/03/2018, 2:22 PM

## 2018-08-03 NOTE — Care Management Note (Addendum)
Case Management Note  Patient Details  Name: Cassandra Gould MRN: [id number] Date of Birth: 09/28/1955  Subjective/Objective:                  62 y.o. female with past medical history of diabetes, hypertension, hyperlipidemia presented to the ER yesterday complaining of a headache that started over the night.  Patient states that she does not usually get a headache.  While in the ER she described a frontal throbbing headache in nature.  Along with weakness in extremities.  Patient did obtain an MRI, MRA, CTA, MRV of head which were all negative Action/Plan: Will follow for progression of care and clinical status. Will follow for case management needs none present at this time.  Expected Discharge Date:                  Expected Discharge Plan:  Home/Self Care  In-House Referral:     Discharge planning Services  CM Consult  Post Acute Care Choice:    Choice offered to:     DME Arranged:    DME Agency:     HH Arranged:    HH Agency:     Status of Service:  In process, will continue to follow  If discussed at Long Length of Stay Meetings, dates discussed:    Additional Comments:  [name], Rhonda Lynn, RN 08/03/2018, 11:25 AM

## 2018-08-04 DIAGNOSIS — R51 Headache: Principal | ICD-10-CM

## 2018-08-04 LAB — BASIC METABOLIC PANEL
Anion gap: 8 (ref 5–15)
BUN: 8 mg/dL (ref 8–23)
CO2: 26 mmol/L (ref 22–32)
CREATININE: 0.87 mg/dL (ref 0.44–1.00)
Calcium: 9.3 mg/dL (ref 8.9–10.3)
Chloride: 105 mmol/L (ref 98–111)
GFR calc Af Amer: >60 mL/min (ref >60)
GLUCOSE: 251 mg/dL — AB (ref 70–99)
Potassium: 3.5 mmol/L (ref 3.5–5.1)
SODIUM: 139 mmol/L (ref 135–145)

## 2018-08-04 LAB — GLUCOSE, CAPILLARY
GLUCOSE-CAPILLARY: 185 mg/dL — AB (ref 70–99)
GLUCOSE-CAPILLARY: 208 mg/dL — AB (ref 70–99)
GLUCOSE-CAPILLARY: 211 mg/dL — AB (ref 70–99)
GLUCOSE-CAPILLARY: 244 mg/dL — AB (ref 70–99)
Glucose-Capillary: 211 mg/dL — ABNORMAL HIGH (ref 70–99)
Glucose-Capillary: 275 mg/dL — ABNORMAL HIGH (ref 70–99)

## 2018-08-04 MED ORDER — INSULIN ASPART 100 UNIT/ML ~~LOC~~ SOLN
4.0000 [IU] | Freq: Three times a day (TID) | SUBCUTANEOUS | Status: DC
  Administered 2018-08-04 – 2018-08-05 (×4): 4 [IU] via SUBCUTANEOUS

## 2018-08-04 MED ORDER — INSULIN DETEMIR 100 UNIT/ML ~~LOC~~ SOLN
24.0000 [IU] | Freq: Every day | SUBCUTANEOUS | Status: DC
  Administered 2018-08-04: 24 [IU] via SUBCUTANEOUS
  Filled 2018-08-04 (×2): qty 0.24

## 2018-08-04 NOTE — Progress Notes (Signed)
Inpatient Diabetes Program Recommendations  AACE/ADA: New Consensus Statement on Inpatient Glycemic Control (2015)  Target Ranges:  Prepandial:   less than 140 mg/dL      Peak postprandial:   less than 180 mg/dL (1-2 hours)      Critically ill patients:  140 - 180 mg/dL   Lab Results  Component Value Date   GLUCAP 211 (H) 08/04/2018   HGBA1C 9.9 (A) 07/27/2018    Review of Glycemic Control  Diabetes history: DM2 Outpatient Diabetes medications: metformin 500 mg bid, Amaryl 4 mg QD, Not yet started Januvia 100 mg QD and Leveir 35 units QHS. Current orders for Inpatient glycemic control: Levemir 15 units QHS, Novolog 0-15 units tidwc  Blood sugars today 211-251 mg/dL. Needs insulin adjustment  Inpatient Diabetes Program Recommendations:     Increase Levemir to 24 units QHS Add Novolog 4 units tidwc for meal coverage insulin if pt eats > 50% meal Need updated HgbA1C to assess glycemic control PTA  Will speak with pt when back from MRI. Continue to follow.   Thank you. Cassandra Gould, RD, LDN, CDE Inpatient Diabetes Coordinator [phone]

## 2018-08-04 NOTE — Progress Notes (Addendum)
CSW met with the patient and her son at bedside. CSW explain role and reason for visit, to assist with disposition planning. CSW explain the SNF process and provided a list of SNF for reference. Patient reports she prefers to go home with home health if she is able. Physical therapy will see the patient tomorrow. CSW will follow up if SNF is recommended.   Kathrin Greathouse, Marlinda Mike, MSW Clinical Social Worker  [phone] 08/04/2018  3:41 PM

## 2018-08-04 NOTE — Evaluation (Signed)
Occupational Therapy Evaluation Patient Details Name: Cassandra Gould MRN: [id number] DOB: 02/03/1956 Today's Date: 08/04/2018    History of Present Illness Patient is a 62 year old female with past medical history of diabetes type 2, hypertension, hyperlipidemia who presented to the emergency department 11/17 with complaints of intractable headache, Hypertension, vision changes.   Clinical Impression   Pt was admitted for the above.  Performed toileting and several adl tasks in bathroom. Pt with HR to 126 and fatiqued after this. Did not formally assess vision this session, but that is her major complaint. Functionally, she was able to reach for things, feed self. She did bump into door jam once. Overall, she needs min A for adls. Will follow in acute setting with supervision level goals    Follow Up Recommendations  SNF;Supervision/Assistance - 24 hour    Equipment Recommendations  None recommended by OT    Recommendations for Other Services       Precautions / Restrictions Precautions Precautions: Fall Precaution Comments: has  L thumb splica/wrist support for tendonitis. Has been wearing on and off for a couple of months      Mobility Bed Mobility Overal bed mobility: Needs Assistance Bed Mobility: Supine to Sit     Supine to sit: Supervision;HOB elevated        Transfers Overall transfer level: Needs assistance Equipment used: Rolling walker (2 wheeled) Transfers: Sit to/from Stand Sit to Stand: Min assist         General transfer comment: cues for technique, usre of bed and rail in bathroom, steady assist to rise and steady with RW    Balance Overall balance assessment: Needs assistance Sitting-balance support: Bilateral upper extremity supported;No upper extremity supported;Feet supported Sitting balance-Leahy Scale: Good     Standing balance support: During functional activity;Bilateral upper extremity supported Standing balance-Leahy Scale:  Fair Standing balance comment: relies on UE                           ADL either performed or assessed with clinical judgement   ADL Overall ADL's : Needs assistance/impaired Eating/Feeding: Independent   Grooming: Wash/dry hands;Supervision/safety;Standing   Upper Body Bathing: Set up;Sitting   Lower Body Bathing: Minimal assistance;Sit to/from stand   Upper Body Dressing : Sitting;Minimal assistance(lines)   Lower Body Dressing: Minimal assistance;Sit to/from stand   Toilet Transfer: Minimal assistance;Ambulation;RW;Comfort height toilet;Grab bars   Toileting- Clothing Manipulation and Hygiene: Minimal assistance;Sit to/from stand         General ADL Comments: ambulated to bathroom. Performed toileting, hygiene, grooming and donning new underwear with assistance. Pt very fatiqued after this     Vision Baseline Vision/History: (needs to be assessed)       Perception     Praxis      Pertinent Vitals/Pain Pain Assessment: No/denies pain     Hand Dominance     Extremity/Trunk Assessment Upper Extremity Assessment Upper Extremity Assessment: Generalized weakness      Cervical / Trunk Assessment Cervical / Trunk Assessment: Normal   Communication Communication Communication: No difficulties   Cognition Arousal/Alertness: Awake/alert Behavior During Therapy: WFL for tasks assessed/performed Overall Cognitive Status: Within Functional Limits for tasks assessed                                     General Comments  HR 101 - 126 when in bathroom.  Pt fatiqued after using toilet and  didn't feel she could walk more    Exercises     Shoulder Instructions      Home Living Family/patient expects to be discharged to:: Private residence Living Arrangements: Children Available Help at Discharge: (mostly)   Home Access: Stairs to enter Secretary/administrator of Steps: 4 Entrance Stairs-Rails: Left Home Layout: One level      Bathroom Shower/Tub: Chief Strategy Officer: Standard     Home Equipment: Shower seat;Bedside commode;Cane - single point          Prior Functioning/Environment Level of Independence: Independent with assistive device(s)        Comments: uses cane. Pt is capable of doing IADLs:  housework and cooking        OT Problem List: Decreased strength;Decreased activity tolerance;Impaired balance (sitting and/or standing);Cardiopulmonary status limiting activity(vision tba)      OT Treatment/Interventions: Self-care/ADL training;Energy conservation;DME and/or AE instruction;Patient/family education;Balance training;Therapeutic activities    OT Goals(Current goals can be found in the care plan section) Acute Rehab OT Goals Patient Stated Goal: to get up , be independnet OT Goal Formulation: With patient/family Time For Goal Achievement: 08/18/18 Potential to Achieve Goals: Good ADL Goals Pt Will Perform Grooming: with supervision;standing Pt Will Transfer to Toilet: with supervision;ambulating;bedside commode;stand pivot transfer Pt Will Perform Toileting - Clothing Manipulation and hygiene: with supervision;sit to/from stand Additional ADL Goal #1: pt will perform adl with set up/supervision and initiate at least 2 rest breaks for energy conservation  OT Frequency: Min 2X/week   Barriers to D/C:            Co-evaluation   Reason for Co-Treatment: For patient/therapist safety PT goals addressed during session: Mobility/safety with mobility OT goals addressed during session: ADL's and self-care      AM-PAC PT "6 Clicks" Daily Activity     Outcome Measure Help from another person eating meals?: None Help from another person taking care of personal grooming?: A Little Help from another person toileting, which includes using toliet, bedpan, or urinal?: A Little Help from another person bathing (including washing, rinsing, drying)?: A Little Help from another  person to put on and taking off regular upper body clothing?: A Little Help from another person to put on and taking off regular lower body clothing?: A Little 6 Click Score: 19   End of Session    Activity Tolerance: Patient tolerated treatment well Patient left: in chair;with call bell/phone within reach;with family/visitor present  OT Visit Diagnosis: Unsteadiness on feet (R26.81);Muscle weakness (generalized) (M62.81)                Time: 4098-1191 OT Time Calculation (min): 30 min Charges:  OT General Charges $OT Visit: 1 Visit OT Evaluation $OT Eval Low Complexity: 1 Low  Marica Otter, OTR/L Acute Rehabilitation Services [phone] [facility] pager [phone] office 08/04/2018  Cassandra Gould 08/04/2018, 11:22 AM

## 2018-08-04 NOTE — Progress Notes (Signed)
Had a long discussion with both patient, son and 3 daughters yesterday about how we will obtain an MRI and if this is negative her blurred vision most likely is secondary to either the hypertension and/or an ophthalmological problem that she should see an ENT as an outpatient.  30 minutes was spent discussing this with family members.  By the end of the conversation they all understood what was talked about and repeated it back to me.  MRI with and without contrast was obtained and did not show any acute intracranial abnormalities.  In addition as noted both exams showed significant functional component.  At this time neurology will sign off however if you have any further questions please feel free to call neurology.  Thank you for this consultation  Felicie MornDavid Smith PA-C [facility] [phone]  M-F  (9:00 am- 5:00 PM)  08/04/2018, 7:54 AM

## 2018-08-04 NOTE — Evaluation (Signed)
Physical Therapy Evaluation Patient Details Name: Cassandra Gould MRN: [id number] DOB: Mar 05, 1956 Today's Date: 08/04/2018   History of Present Illness  Patient is a 62 year old female with past medical history of diabetes type 2, hypertension, hyperlipidemia who presented to the emergency department 11/17 with complaints of intractable headache, Hypertension, vision changes.  Clinical Impression  The patient found in bed eating her breakfast without noticeable  Difficulties. The patient was assisted  To BR , ambulating with RW with min assist to get around small areas. Patient"s HR up to 127 while in BathRoom with OT, BP after activity 155/70( from 118/79)> Patient complains of fatigue and is mildly diaphoretic. Patient is not currently  functioning at her baseline. Patient currently will require assistance at DC. Pt admitted with above diagnosis. Pt currently with functional limitations due to the deficits listed below (see PT Problem List).  Pt will benefit from skilled PT to increase their independence and safety with mobility to allow discharge to the venue listed below.       Follow Up Recommendations SNF;Supervision/Assistance - 24 hour ? HHPT if not eligible for SNF    Equipment Recommendations  None recommended by PT    Recommendations for Other Services       Precautions / Restrictions Precautions Precautions: Fall Precaution Comments: has  L thumb splica/wrist support for tendonitis. Has been wearing on and off for a couple of months      Mobility  Bed Mobility Overal bed mobility: Needs Assistance Bed Mobility: Supine to Sit     Supine to sit: HOB elevated;Supervision        Transfers Overall transfer level: Needs assistance Equipment used: Rolling walker (2 wheeled) Transfers: Sit to/from Stand Sit to Stand: Min assist         General transfer comment: cues for technique, usre of bed and rail in bathroom, steady assist to rise and steady with  RW  Ambulation/Gait Ambulation/Gait assistance: Min assist;+2 safety/equipment Gait Distance (Feet): 10 Feet(x 2) Assistive device: Rolling walker (2 wheeled) Gait Pattern/deviations: Step-through pattern;Step-to pattern;Drifts right/left     General Gait Details: steady assist to manage in room with RW. Gait unsteady. Did not ambulate in hall as HR up to127   Stairs            Wheelchair Mobility    Modified Rankin (Stroke Patients Only)       Balance Overall balance assessment: Needs assistance Sitting-balance support: Bilateral upper extremity supported;No upper extremity supported;Feet supported Sitting balance-Leahy Scale: Good     Standing balance support: During functional activity;Bilateral upper extremity supported Standing balance-Leahy Scale: Fair Standing balance comment: relies on UE                             Pertinent Vitals/Pain Pain Assessment: No/denies pain    Home Living Family/patient expects to be discharged to:: Private residence Living Arrangements: Children Available Help at Discharge: Available 24 hours/day   Home Access: Stairs to enter Entrance Stairs-Rails: Left Entrance Stairs-Number of Steps: 4 Home Layout: One level Home Equipment: Shower seat;Bedside commode;Cane - single point      Prior Function Level of Independence: Independent with assistive device(s)         Comments: uses cane. Pt is capable of doing IADLs:  housework and cooking     Hand Dominance        Extremity/Trunk Assessment        Lower Extremity Assessment Lower Extremity Assessment: Generalized weakness;LLE deficits/detail;RLE  deficits/detail LLE Deficits / Details: mild tingling foot, present prior    Cervical / Trunk Assessment Cervical / Trunk Assessment: Normal  Communication   Communication: No difficulties  Cognition Arousal/Alertness: Awake/alert Behavior During Therapy: WFL for tasks assessed/performed Overall  Cognitive Status: Within Functional Limits for tasks assessed                                        General Comments      Exercises     Assessment/Plan    PT Assessment Patient needs continued PT services  PT Problem List Decreased strength       PT Treatment Interventions DME instruction;Gait training;Stair training;Functional mobility training;Therapeutic activities;Therapeutic exercise;Patient/family education    PT Goals (Current goals can be found in the Care Plan section)  Acute Rehab PT Goals Patient Stated Goal: to get up , be independnet PT Goal Formulation: With patient/family Time For Goal Achievement: 08/18/18 Potential to Achieve Goals: Good    Frequency Min 3X/week   Barriers to discharge        Co-evaluation PT/OT/SLP Co-Evaluation/Treatment: Yes Reason for Co-Treatment: For patient/therapist safety PT goals addressed during session: Mobility/safety with mobility OT goals addressed during session: ADL's and self-care       AM-PAC PT "6 Clicks" Daily Activity  Outcome Measure Difficulty turning over in bed (including adjusting bedclothes, sheets and blankets)?: A Lot Difficulty moving from lying on back to sitting on the side of the bed? : A Lot Difficulty sitting down on and standing up from a chair with arms (e.g., wheelchair, bedside commode, etc,.)?: A Lot Help needed moving to and from a bed to chair (including a wheelchair)?: Total Help needed walking in hospital room?: Total Help needed climbing 3-5 steps with a railing? : Total 6 Click Score: 9    End of Session Equipment Utilized During Treatment: Gait belt Activity Tolerance: Patient limited by fatigue Patient left: in chair;with call bell/phone within reach;with chair alarm set;with family/visitor present Nurse Communication: Mobility status PT Visit Diagnosis: Unsteadiness on feet (R26.81)    Time: 6962-95281015-1045 PT Time Calculation (min) (ACUTE ONLY): 30  min   Charges:   PT Evaluation $PT Eval Low Complexity: 1 Low          Blanchard KelchKaren Ryker Pherigo PT Acute Rehabilitation Services Pager [phone] Office [phone]  Rada HayHill, Amarys Sliwinski Elizabeth 08/04/2018, 11:01 AM

## 2018-08-04 NOTE — Progress Notes (Signed)
PROGRESS NOTE    Cassandra Gould  ZOX:[id number] DOB: [birth year] DOA: 08/01/2018 PCP: Loletta Specter, PA-C   Brief Narrative: Patient is a 62 year old female with past medical history of diabetes type 2, hypertension, hyperlipidemia who presented to the emergency department with complaints of intractable headache.  Headache was mainly in the frontal region.  She was also found to be hypertensive on presentation. She was also started on broad-spectrum antibiotics to cover for possible encephalitis/meningitis.  Lumbar puncture was attempted in the emergency department but was not successful. She later denied LP.  Neurology consulted.  Suspected that her headache is most likely associated  with hypertension.  Assessment & Plan:   Principal Problem:   Intractable headache Active Problems:   Hypertension   Type 2 diabetes mellitus without complication, with long-term current use of insulin (HCC)   Asthma   Weakness   Headache  Intractable headache:Headache has improved now. Complained of severe frontal headache on presentation.  Suspected to be from hypertension.  Headache actually improved with improvement in the blood pressure.  This morning her headache was significantly better. Neurology was following.  She was also complaining of some blurry vision so underwent repeat  MRI to rule out PRES but did not show any acute abnormality Neurology has signed off.She might follow-up with neurology as an outpatient.  Blurry vision: Intermittent.  Her complain of blurry vision is very subjective and nonspecific.  She will follow-up with ophthalmology as an outpatient.  Hypertension: Noted to be in hypertensive urgency presentation.  Also started on nicardipine drip which has improved blood pressure.  Her headache improved with lowering of the blood pressure.  She was on hydrochlorothiazide and lisinopril at home. We have added amlodipine.  She needs to follow-up with her PCP closely regarding the  monitoring of her blood pressure.  Diabetes mellitus type 2: Continue sliding scale insulin for now and levemir . Continue her home medications on discharge.  One 9.9 as per 07/27/18.  Diabetic coordinator following  Anemia: Chronic.  Currently stable.  Hyperlipidemia: On statin at home.  Hypokalemia: Supplemented with potassium  Deconditioning/debility: Patient was advised SNF by PT/OT.  She appeared to be significantly weak.  Recommend skilled nursing facility on discharge.  We will follow-up for the PT/OT recommendations.  We have also requested for social worker consultation for possible enough skilled nursing facility on discharge.  DVT prophylaxis:SCD Code Status: Full Family Communication: daughter present at the bed side Disposition Plan: SNF vs Home with home health likely tomorrow   Consultants: Neurology  Procedures: None  Antimicrobials: None  Subjective: Patient seen and examined the bedside this morning.  Blood pressure had improved.  During my evaluation this morning, her headache was significantly better.   Objective: Vitals:   08/04/18 0800 08/04/18 1000 08/04/18 1100 08/04/18 1216  BP:  140/75 (!) 155/70 (!) 151/87  Pulse:  94  (!) 103  Resp:  (!) 22 (!) 22 (!) 27  Temp: 98.2 F (36.8 C)     TempSrc: Oral     SpO2:  99% 100% 100%  Height:        Intake/Output Summary (Last 24 hours) at 08/04/2018 1343 Last data filed at 08/04/2018 1000 Gross per 24 hour  Intake 578.12 ml  Output 2935 ml  Net -2356.88 ml   There were no vitals filed for this visit.  Examination:  General exam: Appears calm and comfortable ,Not in distress,obese HEENT:PERRL,Oral mucosa moist, Ear/Nose normal on gross exam Respiratory system: Bilateral equal air entry, normal  vesicular breath sounds, no wheezes or crackles  Cardiovascular system: S1 & S2 heard, RRR. No JVD, murmurs, rubs, gallops or clicks. Gastrointestinal system: Abdomen is nondistended, soft and nontender. No  organomegaly or masses felt. Normal bowel sounds heard. Central nervous system: Alert and oriented. No focal neurological deficits. Extremities: No edema, no clubbing ,no cyanosis, distal peripheral pulses palpable. Skin: No rashes, lesions or ulcers,no icterus ,no pallor MSK: Normal muscle bulk,tone ,power Psychiatry: Judgement and insight appear normal. Mood & affect appropriate.       Data Reviewed: I have personally reviewed following labs and imaging studies  CBC: Recent Labs  Lab 08/01/18 2044 08/03/18 0321  WBC 11.3* 8.7  NEUTROABS  --  5.7  HGB 10.6* 10.0*  HCT 34.8* 32.6*  MCV 75.7* 76.2*  PLT 311 281   Basic Metabolic Panel: Recent Labs  Lab 08/01/18 2044 08/03/18 0321 08/04/18 0325  NA 135 136 139  K 3.9 3.1* 3.5  CL 98 101 105  CO2 24 26 26   GLUCOSE 326* 223* 251*  BUN 10 7* 8  CREATININE 0.92 0.95 0.87  CALCIUM 9.3 8.8* 9.3   GFR: Estimated Creatinine Clearance: 91.6 mL/min (by C-G formula based on SCr of 0.87 mg/dL). Liver Function Tests: No results for input(s): AST, ALT, ALKPHOS, BILITOT, PROT, ALBUMIN in the last 168 hours. No results for input(s): LIPASE, AMYLASE in the last 168 hours. Recent Labs  Lab 08/02/18 0739  AMMONIA 29   Coagulation Profile: No results for input(s): INR, PROTIME in the last 168 hours. Cardiac Enzymes: No results for input(s): CKTOTAL, CKMB, CKMBINDEX, TROPONINI in the last 168 hours. BNP (last 3 results) No results for input(s): PROBNP in the last 8760 hours. HbA1C: No results for input(s): HGBA1C in the last 72 hours. CBG: Recent Labs  Lab 08/03/18 1939 08/03/18 2327 08/04/18 0409 08/04/18 0813 08/04/18 1232  GLUCAP 254* 185* 244* 211* 275*   Lipid Profile: No results for input(s): CHOL, HDL, LDLCALC, TRIG, CHOLHDL, LDLDIRECT in the last 72 hours. Thyroid Function Tests: No results for input(s): TSH, T4TOTAL, FREET4, T3FREE, THYROIDAB in the last 72 hours. Anemia Panel: No results for input(s):  VITAMINB12, FOLATE, FERRITIN, TIBC, IRON, RETICCTPCT in the last 72 hours. Sepsis Labs: No results for input(s): PROCALCITON, LATICACIDVEN in the last 168 hours.  Recent Results (from the past 240 hour(s))  MRSA PCR Screening     Status: Abnormal   Collection Time: 08/02/18 12:05 AM  Result Value Ref Range Status   MRSA by PCR POSITIVE (A) NEGATIVE Final    Comment:        The GeneXpert MRSA Assay (FDA approved for NASAL specimens only), is one component of a comprehensive MRSA colonization surveillance program. It is not intended to diagnose MRSA infection nor to guide or monitor treatment for MRSA infections. RESULT CALLED TO, READ BACK BY AND VERIFIED WITH: PATRAM,L AT 0147 ON 08/02/2018 BY MOSLEY,J Performed at [facility], [address] [place], [place] [address]          Radiology Studies: Mr Laqueta JeanBrain W UEWo Contrast  Result Date: 08/03/2018 CLINICAL DATA:  Headache and blurred vision with hypertension. History of diabetes. EXAM: MRI HEAD WITHOUT AND WITH CONTRAST TECHNIQUE: Multiplanar, multiecho pulse sequences of the brain and surrounding structures were obtained without and with intravenous contrast. CONTRAST:  10 mL Gadavist COMPARISON:  Incomplete noncontrast brain MRI 08/01/2018. Head and neck CTA 08/02/2018. FINDINGS: The study is mildly motion degraded. Brain: There is no evidence of acute infarct, intracranial hemorrhage,  mass, midline shift, or extra-axial fluid collection. The ventricles and sulci are within normal limits for age. Patchy cerebral white matter T2 hyperintensities are unchanged and nonspecific but compatible with moderate chronic small vessel ischemic disease. No abnormal enhancement is identified. Vascular: Major intracranial vascular flow voids are preserved. Skull and upper cervical spine: Unremarkable bone marrow signal. Prior anterior cervical fusion. Sinuses/Orbits: Unremarkable orbits. Paranasal sinuses and mastoid air cells  are clear. Other: Borderline to mildly enlarged right level IIa lymph node, nonspecific and unchanged from the recent CTA. IMPRESSION: 1. No acute intracranial abnormality. 2. Moderate chronic small vessel ischemic disease. Electronically Signed   By: Sebastian Ache M.D.   On: 08/03/2018 17:08        Scheduled Meds: . amLODipine  10 mg Oral Daily  . Chlorhexidine Gluconate Cloth  6 each Topical Q0600  . lisinopril  40 mg Oral Daily   And  . hydrochlorothiazide  25 mg Oral Daily  . insulin aspart  0-15 Units Subcutaneous TID WC  . insulin aspart  4 Units Subcutaneous TID WC  . insulin detemir  24 Units Subcutaneous QHS  . mupirocin ointment  1 application Nasal BID   Continuous Infusions:    LOS: 2 days    Time spent:35 mins. More than 50% of that time was spent in counseling and/or coordination of care.      Burnadette Pop, MD [facility] Pager [phone]  If 7PM-7AM, please contact night-coverage [email or web address] Password [email or web address] 08/04/2018, 1:43 PM

## 2018-08-05 ENCOUNTER — Encounter: Payer: Self-pay | Admitting: Neurology

## 2018-08-05 LAB — GLUCOSE, CAPILLARY
GLUCOSE-CAPILLARY: 290 mg/dL — AB (ref 70–99)
Glucose-Capillary: 203 mg/dL — ABNORMAL HIGH (ref 70–99)
Glucose-Capillary: 244 mg/dL — ABNORMAL HIGH (ref 70–99)

## 2018-08-05 MED ORDER — AMLODIPINE BESYLATE 10 MG PO TABS: 10.0000 mg | ORAL_TABLET | Freq: Every day | ORAL | 0 refills | Status: AC

## 2018-08-05 MED ORDER — LISINOPRIL 40 MG PO TABS: 40.0000 mg | ORAL_TABLET | Freq: Every day | ORAL | 0 refills | Status: AC

## 2018-08-05 MED ORDER — HYDRALAZINE HCL 25 MG PO TABS: 25.0000 mg | ORAL_TABLET | Freq: Three times a day (TID) | ORAL | 0 refills | Status: DC

## 2018-08-05 MED ORDER — HYDROCHLOROTHIAZIDE 25 MG PO TABS: 25.0000 mg | ORAL_TABLET | Freq: Every day | ORAL | 0 refills | Status: DC

## 2018-08-05 MED ORDER — AMLODIPINE BESYLATE 10 MG PO TABS: 10.0000 mg | ORAL_TABLET | Freq: Every day | ORAL | 0 refills | Status: DC

## 2018-08-05 MED ORDER — BUTALBITAL-APAP-CAFFEINE 50-325-40 MG PO TABS: 1.0000 | ORAL_TABLET | Freq: Four times a day (QID) | ORAL | 0 refills | Status: DC | PRN

## 2018-08-05 MED ORDER — HYDRALAZINE HCL 25 MG PO TABS: 25.0000 mg | ORAL_TABLET | Freq: Three times a day (TID) | ORAL | 0 refills | Status: AC

## 2018-08-05 MED ORDER — LISINOPRIL 40 MG PO TABS: 40.0000 mg | ORAL_TABLET | Freq: Every day | ORAL | 0 refills | Status: DC

## 2018-08-05 MED ORDER — HYDROCHLOROTHIAZIDE 25 MG PO TABS: 25.0000 mg | ORAL_TABLET | Freq: Every day | ORAL | 0 refills | Status: AC

## 2018-08-05 MED ORDER — ACETAMINOPHEN 325 MG PO TABS: 650.0000 mg | ORAL_TABLET | Freq: Four times a day (QID) | ORAL | 0 refills | Status: AC | PRN

## 2018-08-05 NOTE — Discharge Summary (Addendum)
Physician Discharge Summary  Cassandra Gould KPT:[id number] DOB: 1956/05/04 DOA: 08/01/2018  PCP: Clent Demark, PA-C  Admit date: 08/01/2018 Discharge date: 08/05/2018  Admitted From: Home Disposition:  Home  Discharge Condition:Stable CODE STATUS:FULL Diet recommendation: Heart Healthy   Brief/Interim Summary:  Patient is a 62 year old female with past medical history of diabetes type 2, hypertension, hyperlipidemia who presented to the emergency department with complaints of intractable headache.  Headache was mainly in the frontal region.  She was also found to be hypertensive on presentation.She was also started on broad-spectrum antibiotics to cover for possible encephalitis/meningitis. Lumbar puncture was attempted in the emergency department but was not successful. She later denied LP.  Neurology consulted.  Suspected that her headache is most likely associated  with hypertension.  Headache improved with improvement in her blood pressure.  She was also seen by PT/OT and was initially recommended to go discursive facility but she denied.  She will be discharged home with home health today.  Following problems were addressed during hospitalization:   Intractable headache:Headache has improved now. Complained of severe frontal headache on presentation.  Suspected to be from hypertension.  Headache actually improved with improvement in the blood pressure.  This morning her headache was significantly better. Neurology was following.  She was also complaining of some blurry vision so underwent repeat  MRI to rule out PRES but did not show any acute abnormality Neurology has signed off.She will follow-up with neurology as an outpatient.  Blurry vision: Intermittent.  Her complain of blurry vision is very subjective and nonspecific.  She will follow-up with ophthalmology as an outpatient.  Hypertension: Noted to be in hypertensive urgency presentation.  Also started on nicardipine  drip which has improved blood pressure.  Her headache improved with lowering of the blood pressure.  She was on hydrochlorothiazide and lisinopril at home. We have added amlodipine.  She needs to follow-up with her PCP closely regarding the monitoring of her blood pressure.  She needs  to monitor her blood pressure at home  Diabetes mellitus type 2:  Continue her home medications on discharge.  hbA1C was 9.9 as per 07/27/18.  Diabetic coordinator was following.  Anemia: Chronic.  Currently stable.  Hyperlipidemia: On statin at home.  Hypokalemia: Supplemented with potassium  Deconditioning/debility: Patient was advised SNF by PT/OT.  She appeared to be significantly weak.  She to SNF.  She will be discharged home with home health.   Discharge Diagnoses:  Principal Problem:   Intractable headache Active Problems:   Hypertension   Type 2 diabetes mellitus without complication, with long-term current use of insulin (HCC)   Asthma   Weakness   Headache    Discharge Instructions  Discharge Instructions    Ambulatory referral to Neurology   Complete by:  As directed    New headache syndrome   Diet - low sodium heart healthy   Complete by:  As directed    Discharge instructions   Complete by:  As directed    1) Please follow-up with ophthalmology within a week.  Name and number the provider has been attached.  Please call for appointment. 2) please follow-up with your PCP in a week.  Do a CBC, BMP test during the follow-up. 3) Follow up with neurology as an outpatient in 4 weeks.  Name and number provider group has been attached. 4)Please monitor your blood pressure at home.  Continue to take prescribed medications as instructed.  Closely follow-up with your PCP for the management of the  blood pressure   Increase activity slowly   Complete by:  As directed      Allergies as of 08/05/2018      Reactions   Oxycodone Itching, Swelling, Rash      Medication List    STOP  taking these medications   lisinopril-hydrochlorothiazide 20-12.5 MG tablet Commonly known as:  PRINZIDE,ZESTORETIC   simvastatin 40 MG tablet Commonly known as:  ZOCOR     TAKE these medications   acetaminophen 325 MG tablet Commonly known as:  TYLENOL Take 2 tablets (650 mg total) by mouth every 6 (six) hours as needed for headache (or Fever >/= 101).   amLODipine 10 MG tablet Commonly known as:  NORVASC Take 1 tablet (10 mg total) by mouth daily. Start taking on:  08/06/2018   atorvastatin 40 MG tablet Commonly known as:  LIPITOR Take 1 tablet (40 mg total) by mouth daily.   blood glucose meter kit and supplies Kit Dispense based on patient and insurance preference. Use up to four times daily as directed. (FOR ICD-9 250.00, 250.01).   gabapentin 300 MG capsule Commonly known as:  NEURONTIN Take 2 capsules (600 mg total) by mouth 3 (three) times daily.   glimepiride 4 MG tablet Commonly known as:  AMARYL Take 4 mg by mouth daily with breakfast.   hydrALAZINE 25 MG tablet Commonly known as:  APRESOLINE Take 1 tablet (25 mg total) by mouth 3 (three) times daily.   hydrochlorothiazide 25 MG tablet Commonly known as:  HYDRODIURIL Take 1 tablet (25 mg total) by mouth daily. Start taking on:  08/06/2018   Insulin Detemir 100 UNIT/ML Pen Commonly known as:  LEVEMIR Inject 35 Units into the skin daily at 10 pm.   Insulin Pen Needle 32G X 6 MM Misc 1 each by Does not apply route at bedtime.   lisinopril 40 MG tablet Commonly known as:  PRINIVIL,ZESTRIL Take 1 tablet (40 mg total) by mouth daily. Start taking on:  08/06/2018   metFORMIN 1000 MG tablet Commonly known as:  GLUCOPHAGE TAKE 1 TABLET BY MOUTH TWICE DAILY WITH MEALS What changed:    how much to take  how to take this  when to take this  additional instructions   neomycin-bacitracin-polymyxin ointment Commonly known as:  NEOSPORIN Apply 1 application topically every 12 (twelve) hours.    sitaGLIPtin 100 MG tablet Commonly known as:  JANUVIA Take 1 tablet (100 mg total) by mouth daily.      Follow-up Information    Clent Demark, PA-C In 1 week.   Specialty:  Physician Assistant Contact information: [place] [address] [phone]        Katy Apo, MD. Schedule an appointment as soon as possible for a visit in 1 week(s).   Specialty:  Ophthalmology Contact information: [place] [address] [phone]        [facility]. Schedule an appointment as soon as possible for a visit in 4 week(s).   Specialty:  Neurology Contact information: [place] [phone]         Allergies  Allergen Reactions  . Oxycodone Itching, Swelling and Rash    Consultations:  Neurology   Procedures/Studies: Ct Angio Head W Or Wo Contrast  Result Date: 08/02/2018 CLINICAL DATA:  Headaches EXAM: CT ANGIOGRAPHY HEAD AND NECK TECHNIQUE: Multidetector CT imaging of the head and neck was performed using the standard protocol during bolus administration of intravenous contrast.  Multiplanar CT image reconstructions and MIPs were obtained to evaluate the vascular anatomy. Carotid stenosis measurements (when applicable) are obtained utilizing NASCET criteria, using the distal internal carotid diameter as the denominator. CONTRAST:  143m ISOVUE-370 IOPAMIDOL (ISOVUE-370) INJECTION 76% COMPARISON:  Brain MRI 08/01/2018 and head CT 08/01/2018 FINDINGS: CTA NECK FINDINGS SKELETON: There is no bony spinal canal stenosis. No lytic or blastic lesion. OTHER NECK: Normal pharynx, larynx and major salivary glands. No cervical lymphadenopathy. Unremarkable thyroid gland. UPPER CHEST: No pneumothorax or pleural effusion. No nodules or masses. AORTIC ARCH: There is mild calcific atherosclerosis of the aortic arch. There is no aneurysm, dissection or hemodynamically significant  stenosis of the visualized ascending aorta and aortic arch. Normal variant aortic arch branching pattern with the brachiocephalic and left common carotid arteries sharing a common origin. The visualized proximal subclavian arteries are widely patent. RIGHT CAROTID SYSTEM: --Common carotid artery: Widely patent origin without common carotid artery dissection or aneurysm. --Internal carotid artery: Normal without aneurysm, dissection or stenosis. --External carotid artery: No acute abnormality. LEFT CAROTID SYSTEM: --Common carotid artery: Widely patent origin without common carotid artery dissection or aneurysm. --Internal carotid artery: Normal without aneurysm, dissection or stenosis. --External carotid artery: No acute abnormality. VERTEBRAL ARTERIES: Right dominant configuration. Both origins are normal. No dissection, occlusion or flow-limiting stenosis to the vertebrobasilar confluence. CTA HEAD FINDINGS ANTERIOR CIRCULATION: --Intracranial internal carotid arteries: Normal. --Anterior cerebral arteries: Normal. Both A1 segments are present. Patent anterior communicating artery. --Middle cerebral arteries: Normal. --Posterior communicating arteries: Absent bilaterally. POSTERIOR CIRCULATION: --Basilar artery: Normal. --Posterior cerebral arteries: Normal. --Superior cerebellar arteries: Normal. --Inferior cerebellar arteries: Normal anterior and posterior inferior cerebellar arteries. VENOUS SINUSES: As permitted by contrast timing, patent. ANATOMIC VARIANTS: None DELAYED PHASE: No parenchymal contrast enhancement. Review of the MIP images confirms the above findings. IMPRESSION: Normal CTA of the head and neck. Electronically Signed   By: KUlyses JarredM.D.   On: 08/02/2018 01:39   Ct Head Wo Contrast  Result Date: 08/01/2018 CLINICAL DATA:  Frontal headache, vomiting EXAM: CT HEAD WITHOUT CONTRAST TECHNIQUE: Contiguous axial images were obtained from the base of the skull through the vertex without  intravenous contrast. COMPARISON:  None. FINDINGS: Brain: No evidence of acute infarction, hemorrhage, hydrocephalus, extra-axial collection or mass lesion/mass effect. Mild subcortical white matter and periventricular small vessel ischemic changes. Vascular: No hyperdense vessel or unexpected calcification. Skull: Normal. Negative for fracture or focal lesion. Sinuses/Orbits: The visualized paranasal sinuses are essentially clear. The mastoid air cells are unopacified. Other: None. IMPRESSION: No evidence of acute intracranial abnormality. Mild small vessel ischemic changes. Electronically Signed   By: SJulian HyM.D.   On: 08/01/2018 10:57   Ct Angio Neck W And/or Wo Contrast  Result Date: 08/02/2018 CLINICAL DATA:  Headaches EXAM: CT ANGIOGRAPHY HEAD AND NECK TECHNIQUE: Multidetector CT imaging of the head and neck was performed using the standard protocol during bolus administration of intravenous contrast. Multiplanar CT image reconstructions and MIPs were obtained to evaluate the vascular anatomy. Carotid stenosis measurements (when applicable) are obtained utilizing NASCET criteria, using the distal internal carotid diameter as the denominator. CONTRAST:  109mISOVUE-370 IOPAMIDOL (ISOVUE-370) INJECTION 76% COMPARISON:  Brain MRI 08/01/2018 and head CT 08/01/2018 FINDINGS: CTA NECK FINDINGS SKELETON: There is no bony spinal canal stenosis. No lytic or blastic lesion. OTHER NECK: Normal pharynx, larynx and major salivary glands. No cervical lymphadenopathy. Unremarkable thyroid gland. UPPER CHEST: No pneumothorax or pleural effusion. No nodules or masses. AORTIC ARCH: There is mild calcific atherosclerosis of  the aortic arch. There is no aneurysm, dissection or hemodynamically significant stenosis of the visualized ascending aorta and aortic arch. Normal variant aortic arch branching pattern with the brachiocephalic and left common carotid arteries sharing a common origin. The visualized proximal  subclavian arteries are widely patent. RIGHT CAROTID SYSTEM: --Common carotid artery: Widely patent origin without common carotid artery dissection or aneurysm. --Internal carotid artery: Normal without aneurysm, dissection or stenosis. --External carotid artery: No acute abnormality. LEFT CAROTID SYSTEM: --Common carotid artery: Widely patent origin without common carotid artery dissection or aneurysm. --Internal carotid artery: Normal without aneurysm, dissection or stenosis. --External carotid artery: No acute abnormality. VERTEBRAL ARTERIES: Right dominant configuration. Both origins are normal. No dissection, occlusion or flow-limiting stenosis to the vertebrobasilar confluence. CTA HEAD FINDINGS ANTERIOR CIRCULATION: --Intracranial internal carotid arteries: Normal. --Anterior cerebral arteries: Normal. Both A1 segments are present. Patent anterior communicating artery. --Middle cerebral arteries: Normal. --Posterior communicating arteries: Absent bilaterally. POSTERIOR CIRCULATION: --Basilar artery: Normal. --Posterior cerebral arteries: Normal. --Superior cerebellar arteries: Normal. --Inferior cerebellar arteries: Normal anterior and posterior inferior cerebellar arteries. VENOUS SINUSES: As permitted by contrast timing, patent. ANATOMIC VARIANTS: None DELAYED PHASE: No parenchymal contrast enhancement. Review of the MIP images confirms the above findings. IMPRESSION: Normal CTA of the head and neck. Electronically Signed   By: Ulyses Jarred M.D.   On: 08/02/2018 01:39   Mr Brain Wo Contrast  Result Date: 08/01/2018 CLINICAL DATA:  62 year old female with frontal headache since yesterday. Worst headache of life. EXAM: MRI HEAD WITHOUT CONTRAST TECHNIQUE: Multiplanar, multiecho pulse sequences of the brain and surrounding structures were obtained without intravenous contrast. COMPARISON:  Head CT earlier today. FINDINGS: The examination had to be discontinued prior to completion by patient request.  Axial T1 and coronal T2 weighted images were not obtained. Brain: No restricted diffusion to suggest acute infarction. No midline shift, mass effect, evidence of mass lesion, ventriculomegaly, extra-axial collection or acute intracranial hemorrhage. Cervicomedullary junction and pituitary are within normal limits. No chronic cerebral blood products identified. Scattered and patchy bilateral cerebral white matter T2 and FLAIR hyperintensity. No cortical encephalomalacia. The deep gray matter nuclei, brainstem, and cerebellum appear within normal limits. Vascular: Major intracranial vascular flow voids are preserved. Skull and upper cervical spine: Partially visible cervical ACDF. Normal bone marrow signal. Sinuses/Orbits: Mildly Disconjugate gaze but otherwise negative orbits. Paranasal sinuses and mastoids are stable and well pneumatized. Other: Visualized scalp soft tissues are within normal limits. IMPRESSION: 1. The majority of the study was performed but the examination was discontinued prior to completion by patient request. 2.  No acute intracranial abnormality identified. 3. Moderate for age cerebral white matter signal changes are nonspecific but most commonly due to chronic small vessel disease. Electronically Signed   By: Genevie Ann M.D.   On: 08/01/2018 15:43   Mr Jeri Cos EX Contrast  Result Date: 08/03/2018 CLINICAL DATA:  Headache and blurred vision with hypertension. History of diabetes. EXAM: MRI HEAD WITHOUT AND WITH CONTRAST TECHNIQUE: Multiplanar, multiecho pulse sequences of the brain and surrounding structures were obtained without and with intravenous contrast. CONTRAST:  10 mL Gadavist COMPARISON:  Incomplete noncontrast brain MRI 08/01/2018. Head and neck CTA 08/02/2018. FINDINGS: The study is mildly motion degraded. Brain: There is no evidence of acute infarct, intracranial hemorrhage, mass, midline shift, or extra-axial fluid collection. The ventricles and sulci are within normal limits  for age. Patchy cerebral white matter T2 hyperintensities are unchanged and nonspecific but compatible with moderate chronic small vessel ischemic disease. No abnormal enhancement  is identified. Vascular: Major intracranial vascular flow voids are preserved. Skull and upper cervical spine: Unremarkable bone marrow signal. Prior anterior cervical fusion. Sinuses/Orbits: Unremarkable orbits. Paranasal sinuses and mastoid air cells are clear. Other: Borderline to mildly enlarged right level IIa lymph node, nonspecific and unchanged from the recent CTA. IMPRESSION: 1. No acute intracranial abnormality. 2. Moderate chronic small vessel ischemic disease. Electronically Signed   By: Logan Bores M.D.   On: 08/03/2018 17:08   Dg Chest Port 1 View  Result Date: 08/02/2018 CLINICAL DATA:  Headache. History of asthmatic bronchitis and diabetes. No prior chest complaints. Former smoker. EXAM: PORTABLE CHEST 1 VIEW COMPARISON:  PA and lateral chest x-ray of August 30, 2016 FINDINGS: The lungs are adequately inflated. There is no focal infiltrate. The cardiac silhouette is mildly enlarged. The central pulmonary vascularity is prominent but stable allowing for the portable technique. There is calcification in the wall of the aortic arch. There is no pleural effusion. IMPRESSION: Stable top-normal cardiac size with central pulmonary vascular prominence. No alveolar pneumonia nor definite pulmonary edema. Electronically Signed   By: David  Martinique M.D.   On: 08/02/2018 07:37      Subjective: Patient seen and examined the bedside this morning.  Blood pressure is better than yesterday.  Headache is much better.  She participated with OT. Stable for discharge to home today.  Discharge Exam: Vitals:   08/05/18 1200 08/05/18 1500  BP: (!) 157/78 (!) 154/87  Pulse: 90 87  Resp: 19 (!) 34  Temp:    SpO2: 100% 100%   Vitals:   08/05/18 1000 08/05/18 1156 08/05/18 1200 08/05/18 1500  BP: (!) 158/73  (!) 157/78 (!)  154/87  Pulse: (!) 109  90 87  Resp: (!) 26  19 (!) 34  Temp:  98.3 F (36.8 C)    TempSrc:  Oral    SpO2: 100%  100% 100%  Weight:      Height:        General: Pt is alert, awake, not in acute distress,obese Cardiovascular: RRR, S1/S2 +, no rubs, no gallops Respiratory: CTA bilaterally, no wheezing, no rhonchi Abdominal: Soft, NT, ND, bowel sounds + Extremities: no edema, no cyanosis    The results of significant diagnostics from this hospitalization (including imaging, microbiology, ancillary and laboratory) are listed below for reference.     Microbiology: Recent Results (from the past 240 hour(s))  MRSA PCR Screening     Status: Abnormal   Collection Time: 08/02/18 12:05 AM  Result Value Ref Range Status   MRSA by PCR POSITIVE (A) NEGATIVE Final    Comment:        The GeneXpert MRSA Assay (FDA approved for NASAL specimens only), is one component of a comprehensive MRSA colonization surveillance program. It is not intended to diagnose MRSA infection nor to guide or monitor treatment for MRSA infections. RESULT CALLED TO, READ BACK BY AND VERIFIED WITH: PATRAM,L AT 0147 ON 08/02/2018 BY MOSLEY,J Performed at [facility], [place] [address], [place], [place] [address]      Labs: BNP (last 3 results) No results for input(s): BNP in the last 8760 hours. Basic Metabolic Panel: Recent Labs  Lab 08/01/18 2044 08/03/18 0321 08/04/18 0325  NA 135 136 139  K 3.9 3.1* 3.5  CL 98 101 105  CO2 '24 26 26  ' GLUCOSE 326* 223* 251*  BUN 10 7* 8  CREATININE 0.92 0.95 0.87  CALCIUM 9.3 8.8* 9.3   Liver Function Tests: No results  for input(s): AST, ALT, ALKPHOS, BILITOT, PROT, ALBUMIN in the last 168 hours. No results for input(s): LIPASE, AMYLASE in the last 168 hours. Recent Labs  Lab 08/02/18 0739  AMMONIA 29   CBC: Recent Labs  Lab 08/01/18 2044 08/03/18 0321  WBC 11.3* 8.7  NEUTROABS  --  5.7  HGB 10.6* 10.0*  HCT 34.8* 32.6*  MCV 75.7*  76.2*  PLT 311 281   Cardiac Enzymes: No results for input(s): CKTOTAL, CKMB, CKMBINDEX, TROPONINI in the last 168 hours. BNP: Invalid input(s): POCBNP CBG: Recent Labs  Lab 08/04/18 2032 08/04/18 2341 08/05/18 0326 08/05/18 0736 08/05/18 1137  GLUCAP 185* 208* 203* 244* 290*   D-Dimer No results for input(s): DDIMER in the last 72 hours. Hgb A1c No results for input(s): HGBA1C in the last 72 hours. Lipid Profile No results for input(s): CHOL, HDL, LDLCALC, TRIG, CHOLHDL, LDLDIRECT in the last 72 hours. Thyroid function studies No results for input(s): TSH, T4TOTAL, T3FREE, THYROIDAB in the last 72 hours.  Invalid input(s): FREET3 Anemia work up No results for input(s): VITAMINB12, FOLATE, FERRITIN, TIBC, IRON, RETICCTPCT in the last 72 hours. Urinalysis    Component Value Date/Time   COLORURINE STRAW (A) 08/01/2018 2130   APPEARANCEUR CLEAR 08/01/2018 2130   LABSPEC 1.021 08/01/2018 2130   PHURINE 7.0 08/01/2018 2130   GLUCOSEU >=500 (A) 08/01/2018 2130   HGBUR SMALL (A) 08/01/2018 2130   BILIRUBINUR NEGATIVE 08/01/2018 2130   KETONESUR 20 (A) 08/01/2018 2130   PROTEINUR 30 (A) 08/01/2018 2130   UROBILINOGEN 1.0 12/21/2008 1619   NITRITE NEGATIVE 08/01/2018 2130   LEUKOCYTESUR TRACE (A) 08/01/2018 2130   Sepsis Labs Invalid input(s): PROCALCITONIN,  WBC,  LACTICIDVEN Microbiology Recent Results (from the past 240 hour(s))  MRSA PCR Screening     Status: Abnormal   Collection Time: 08/02/18 12:05 AM  Result Value Ref Range Status   MRSA by PCR POSITIVE (A) NEGATIVE Final    Comment:        The GeneXpert MRSA Assay (FDA approved for NASAL specimens only), is one component of a comprehensive MRSA colonization surveillance program. It is not intended to diagnose MRSA infection nor to guide or monitor treatment for MRSA infections. RESULT CALLED TO, READ BACK BY AND VERIFIED WITH: PATRAM,L AT 0147 ON 08/02/2018 BY MOSLEY,J Performed at [facility], [place] [address], [place],  [address]     Please note: You were cared for by a hospitalist during your hospital stay. Once you are discharged, your primary care physician will handle any further medical issues. Please note that NO REFILLS for any discharge medications will be authorized once you are discharged, as it is imperative that you return to your primary care physician (or establish a relationship with a primary care physician if you do not have one) for your post hospital discharge needs so that they can reassess your need for medications and monitor your lab values.    Time coordinating discharge: 40 minutes  SIGNED:   Shelly Coss, MD  [facility] 08/05/2018, 3:27 PM Pager [phone]  If 7PM-7AM, please contact night-coverage [email or web address] Password [email or web address]

## 2018-08-05 NOTE — Care Management Note (Signed)
Case Management Note  Patient Details  Name: Cassandra Gould MRN: [id number] Date of Birth: 06/07/1956  Subjective/Objective:                  discharged  Action/Plan: [facility] will do [facility] pt and ot  Expected Discharge Date:  08/05/18               Expected Discharge Plan:  Home w Home Health Services  In-House Referral:     Discharge planning Services  CM Consult  Post Acute Care Choice:    Choice offered to:     DME Arranged:    DME Agency:     HH Arranged:  PT, OT [facility]  Status of Service:  Completed, signed off  If discussed at Microsoft[employer] Length of Stay Meetings, dates discussed:    Additional Comments:  [name], Illana Nolting Lynn, RN 08/05/2018, 3:08 PM

## 2018-08-05 NOTE — Care Management Note (Signed)
Case Management Note  Patient Details  Name: Cassandra Gould MRN: [id number] Date of Birth: 09/02/1956  Subjective/Objective:                  Intractable headache  Action/Plan: snf versus hhc following for orders and disposition  Expected Discharge Date:                  Expected Discharge Plan:  Home/Self Care  In-House Referral:     Discharge planning Services  CM Consult  Post Acute Care Choice:    Choice offered to:     DME Arranged:    DME Agency:     HH Arranged:    HH Agency:     Status of Service:  In process, will continue to follow  If discussed at Long Length of Stay Meetings, dates discussed:    Additional Comments:  [name], Dragon Thrush Lynn, RN 08/05/2018, 9:41 AM

## 2018-08-05 NOTE — Progress Notes (Signed)
Inpatient Diabetes Program Recommendations  AACE/ADA: New Consensus Statement on Inpatient Glycemic Control (2015)  Target Ranges:  Prepandial:   less than 140 mg/dL      Peak postprandial:   less than 180 mg/dL (1-2 hours)      Critically ill patients:  140 - 180 mg/dL   Lab Results  Component Value Date   GLUCAP 290 (H) 08/05/2018   HGBA1C 9.9 (A) 07/27/2018    Review of Glycemic Control  Blood sugars in 200s. (203-290 mg/dL) Would benefit from insulin adjustment.  Inpatient Diabetes Program Recommendations:     Increase Levemir to 35 units QHS Increase Novolog to 6 units tidwc for meal coverage insulin.  Continue to follow while inpatient.   Thank you. Cassandra Gould, RD, LDN, CDE Inpatient Diabetes Coordinator [phone]

## 2018-08-05 NOTE — Care Management Note (Signed)
Case Management Note  Patient Details  Name: Cassandra Gould MRN: [id number] Date of Birth: 03/27/1956  Subjective/Objective:                  Discharge planning  Action/Plan: Patient wants to go home with hhc/ does not have preference will give to whom takes bcbs/wants to know cost-benefits check sent to sec.  Expected Discharge Date:                  Expected Discharge Plan:  Home/Self Care  In-House Referral:     Discharge planning Services  CM Consult  Post Acute Care Choice:    Choice offered to:     DME Arranged:    DME Agency:     HH Arranged:    HH Agency:     Status of Service:  In process, will continue to follow  If discussed at Long Length of Stay Meetings, dates discussed:    Additional Comments:  [name],  Lynn, RN 08/05/2018, 11:31 AM

## 2018-08-05 NOTE — Progress Notes (Signed)
Pt given discharge instructions with understanding. Pt has no questions at this time. Son and daughter at bedside. Pt instructed to take medications as prescribe and monitor blood pressure daily at home. IV and monitor d/c Pt's children here to take pt home.

## 2018-08-05 NOTE — Progress Notes (Addendum)
Occupational Therapy Treatment Patient Details Name: Cassandra GillisShirley Gould MRN: [id number] DOB: 05/29/1956 Today's Date: 08/05/2018    History of present illness Patient is a 62 year old female with past medical history of diabetes type 2, hypertension, hyperlipidemia who presented to the emergency department 11/17 with complaints of intractable headache, Hypertension, vision changes.   OT comments  Pt needs mostly min guard assist for mobility and min A for adls with rest breaks as she fatiques quickly.  Educated on energy conservation. Pt/family prefer home with 24/7. Recommend HHOT to help build strength and endurance to reach PLOF   Follow Up Recommendations  Home health OT;Supervision/Assistance - 24 hour(would benefit from snf; pt/family prefer home)    Equipment Recommendations  None recommended by OT    Recommendations for Other Services      Precautions / Restrictions Precautions Precautions: Fall Precaution Comments: has  L thumb splica/wrist support for tendonitis. Has been wearing on and off for a couple of months Restrictions Weight Bearing Restrictions: No       Mobility Bed Mobility                  Transfers   Equipment used: Rolling walker (2 wheeled)   Sit to Stand: Min guard         General transfer comment: for safety    Balance                                           ADL either performed or assessed with clinical judgement   ADL       Grooming: Wash/dry hands;Supervision/safety;Standing               Lower Body Dressing: Minimal assistance;Sit to/from stand   Toilet Transfer: Min guard;Ambulation;Comfort height toilet;RW;Grab bars   Toileting- Clothing Manipulation and Hygiene: Min guard;Sit to/from stand         General ADL Comments: ambulated to bathroom.  Pt limited by fatique/HR, similar to yesterday. Educated on energy conservation     Vision  Pt states her vision is OK. She was using phone when I  arrived.       Perception     Praxis      Cognition Arousal/Alertness: Awake/alert Behavior During Therapy: WFL for tasks assessed/performed Overall Cognitive Status: Within Functional Limits for tasks assessed                                          Exercises     Shoulder Instructions       General Comments HR 75-126  Fatiques quickly    Pertinent Vitals/ Pain       Pain Assessment: No/denies pain  Home Living                                          Prior Functioning/Environment              Frequency  Min 2X/week        Progress Toward Goals  OT Goals(current goals can now be found in the care plan section)  Progress towards OT goals: Progressing toward goals     Plan      Co-evaluation  AM-PAC PT "6 Clicks" Daily Activity     Outcome Measure   Help from another person eating meals?: None Help from another person taking care of personal grooming?: A Little Help from another person toileting, which includes using toliet, bedpan, or urinal?: A Little Help from another person bathing (including washing, rinsing, drying)?: A Little Help from another person to put on and taking off regular upper body clothing?: A Little Help from another person to put on and taking off regular lower body clothing?: A Little 6 Click Score: 19    End of Session    OT Visit Diagnosis: Unsteadiness on feet (R26.81);Muscle weakness (generalized) (M62.81)   Activity Tolerance Patient limited by fatigue   Patient Left in chair;with call bell/phone within reach;with family/visitor present   Nurse Communication          Time: 1610-9604 OT Time Calculation (min): 22 min  Charges: OT General Charges $OT Visit: 1 Visit OT Treatments $Self Care/Home Management : 8-22 mins  Marica Otter, OTR/L Acute Rehabilitation Services [phone] [facility] pager [phone]  office 08/05/2018   Murlean Seelye 08/05/2018, 10:58 AM

## 2018-08-24 ENCOUNTER — Ambulatory Visit (INDEPENDENT_AMBULATORY_CARE_PROVIDER_SITE_OTHER): Payer: Self-pay | Admitting: Physician Assistant

## 2018-10-13 ENCOUNTER — Encounter

## 2018-10-13 ENCOUNTER — Ambulatory Visit: Payer: Self-pay | Admitting: Neurology

## 2018-12-13 ENCOUNTER — Ambulatory Visit: Payer: Self-pay | Admitting: Neurology

## 2019-04-15 ENCOUNTER — Other Ambulatory Visit: Payer: Self-pay

## 2019-04-15 DIAGNOSIS — Z20822 Contact with and (suspected) exposure to covid-19: Secondary | ICD-10-CM

## 2019-04-17 LAB — NOVEL CORONAVIRUS, NAA: SARS-CoV-2, NAA: DETECTED — AB

## 2020-01-30 IMAGING — MR MR HEAD W/O CM
5 of 6 series · 35 of 48 positions shown · non-contrast
Comparison: Head CT earlier today.

CLINICAL DATA: 62-year-old female with frontal headache since
yesterday. Worst headache of life.

EXAM:
MRI HEAD WITHOUT CONTRAST
TECHNIQUE: Multiplanar, multiecho pulse sequences of the brain and surrounding
structures were obtained without intravenous contrast.

[Series 3: T1 · sagittal · 5.0mm · 0.47mm/px · 4 of 22 slices shown]
[im 1/22]
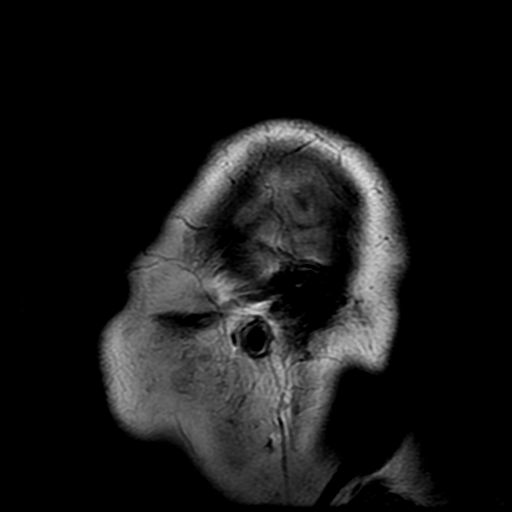
[im 6/22]
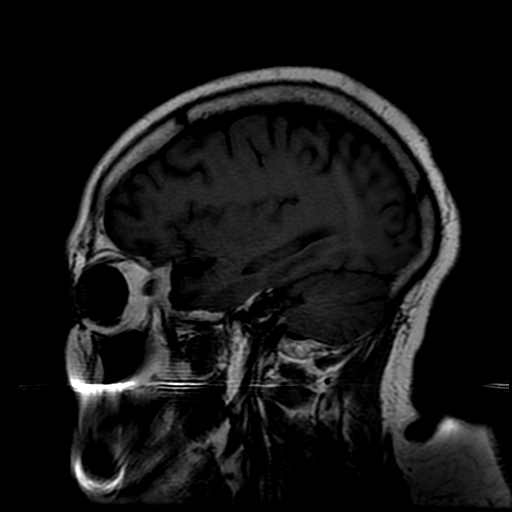
[im 11/22]
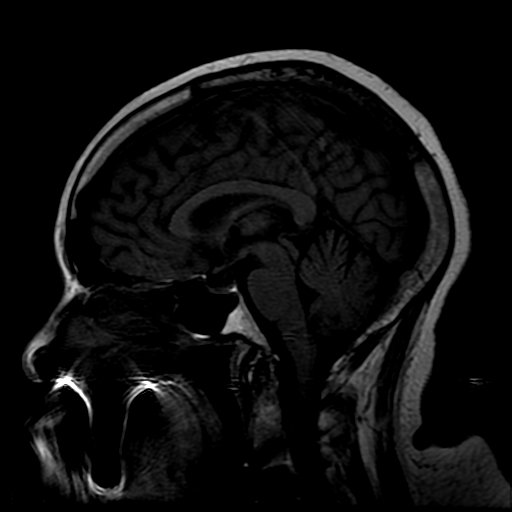
[im 16/22]
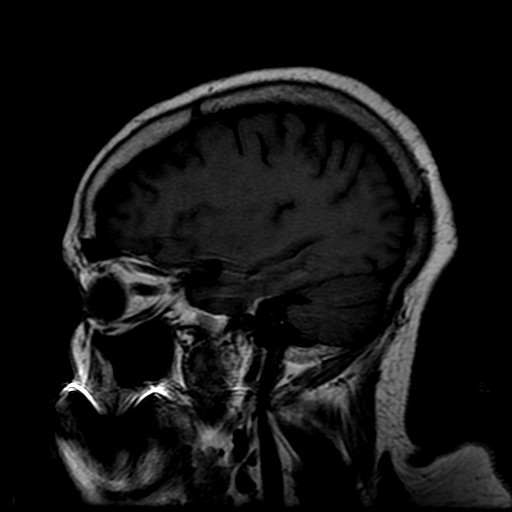

[Series 4: DWI · axial · 4.0mm · 1.17mm/px · z∈[-17,+118]mm · 10 of 66 slices shown (1 of 2)]
[im 5/66]
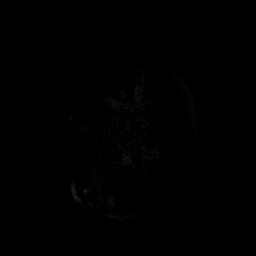
[im 9/66]
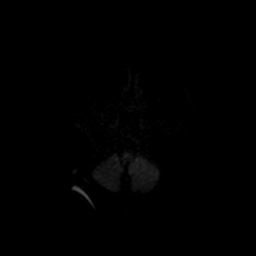
[im 14/66]
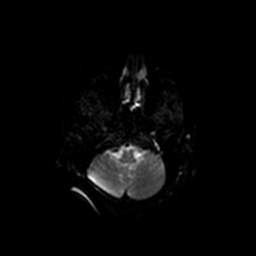
[im 22/66]
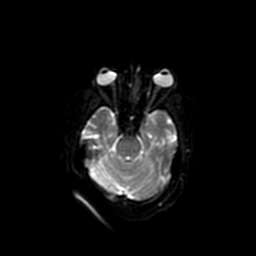
[im 31/66]
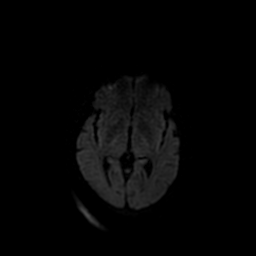
[im 35/66]
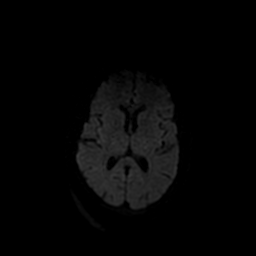
[im 40/66]
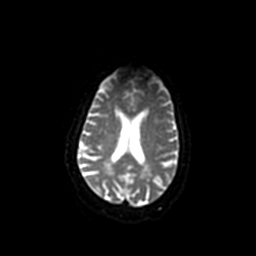
[im 48/66]
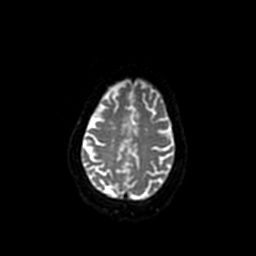
[im 57/66]
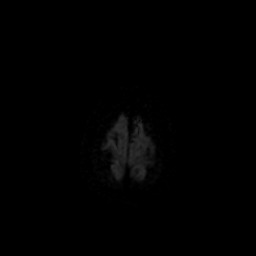
[im 66/66]
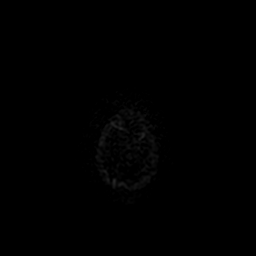

[Series 5: T2 · axial · 5.0mm · 0.86mm/px · z∈[-22,+124]mm · 6 of 22 slices shown]
[im 1/22]
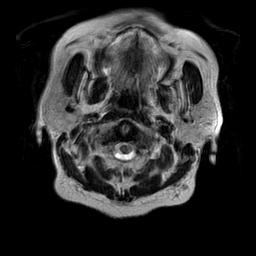
[im 5/22]
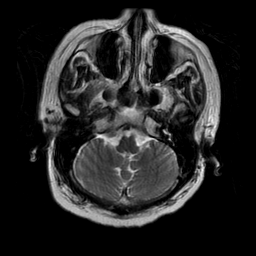
[im 9/22]
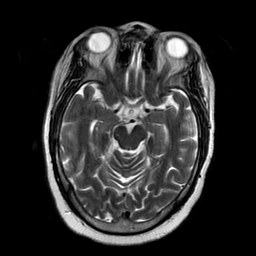
[im 13/22]
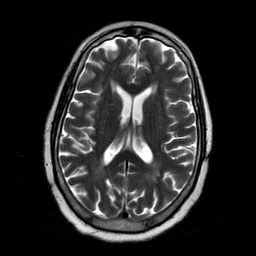
[im 17/22]
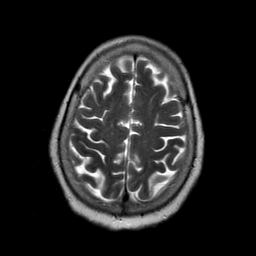
[im 22/22]
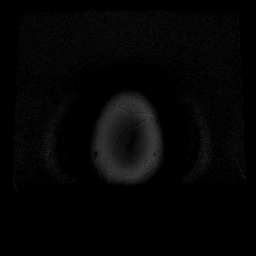

[Series 6: FLAIR · axial · 3.0mm · 0.43mm/px · z∈[-24,+126]mm · 7 of 26 slices shown]
[im 1/26]
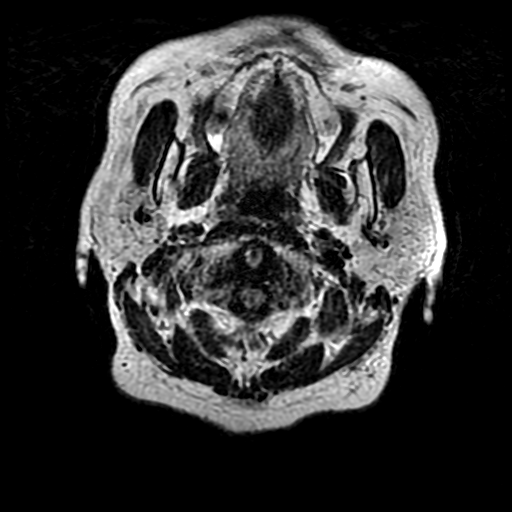
[im 5/26]
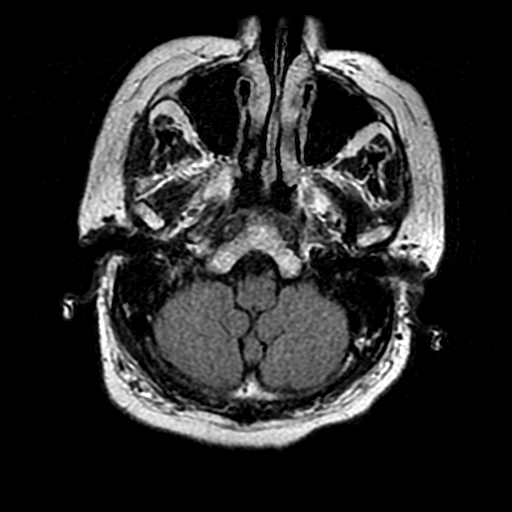
[im 9/26]
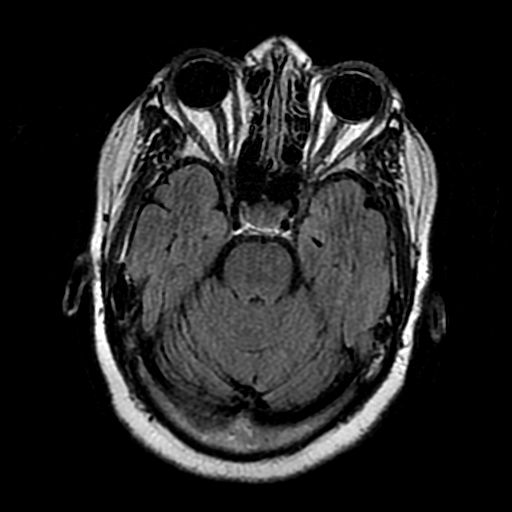
[im 13/26]
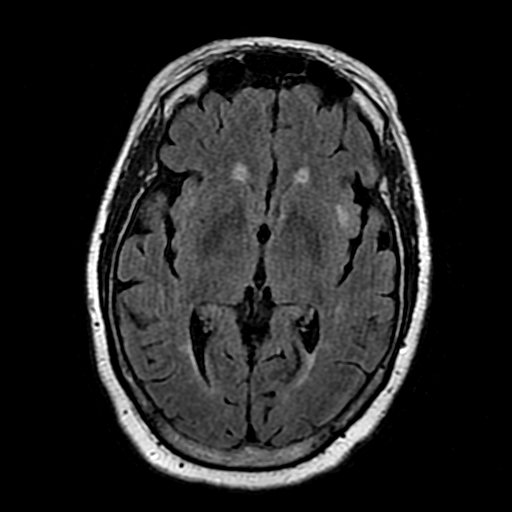
[im 17/26]
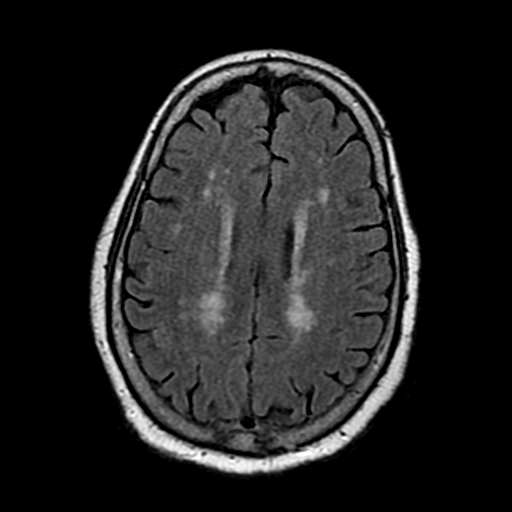
[im 21/26]
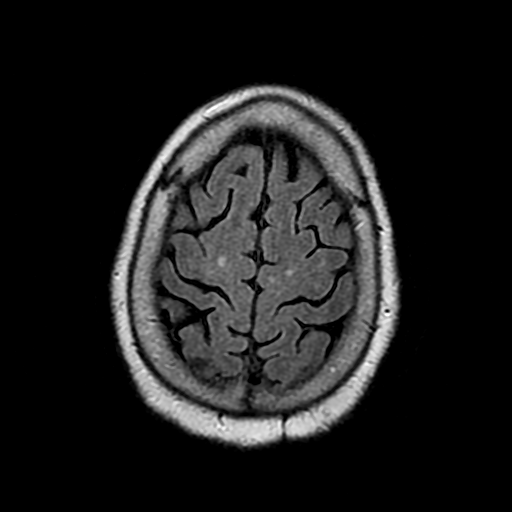
[im 26/26]
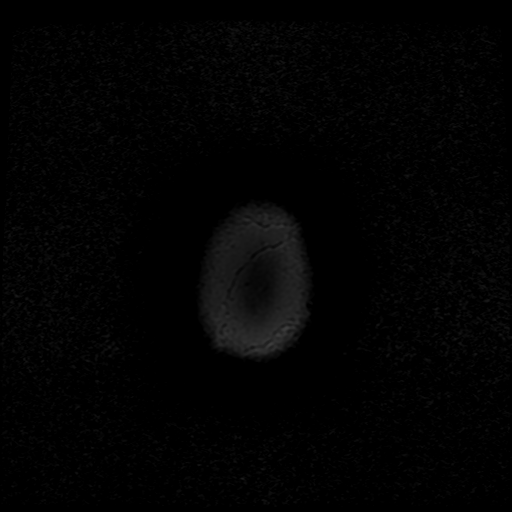

[Series 400: DWI · axial · 4.0mm · 1.17mm/px · z∈[-26,+118]mm · 8 of 33 slices shown (2 of 2)]
[im 1/33]
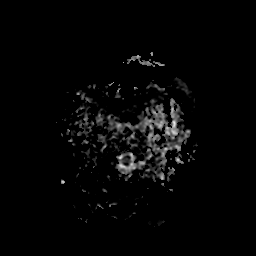
[im 5/33]
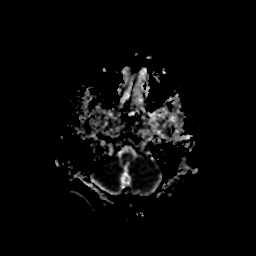
[im 10/33]
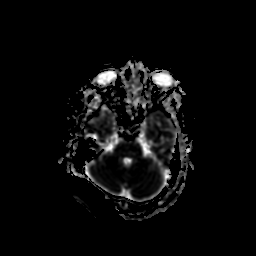
[im 14/33]
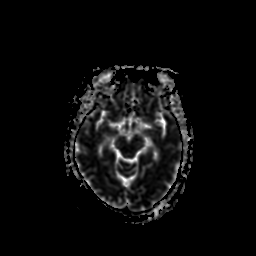
[im 19/33]
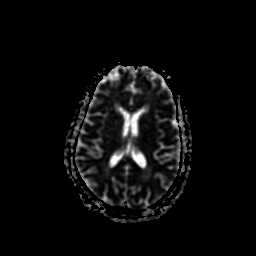
[im 23/33]
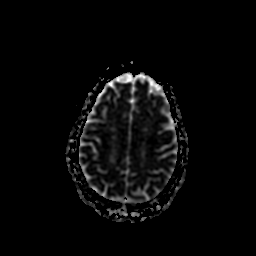
[im 28/33]
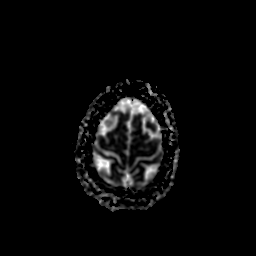
[im 33/33]
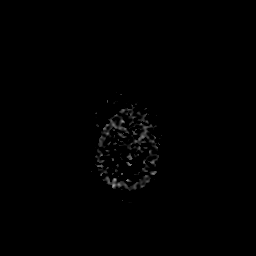

[35 of 48 positions shown; findings below may reference images not displayed]

FINDINGS: The examination had to be discontinued prior to completion by
patient request. Axial T1 and coronal T2 weighted images were not
obtained..

Brain: No restricted diffusion to suggest acute infarction. No
midline shift, mass effect, evidence of mass lesion,
ventriculomegaly, extra-axial collection or acute intracranial
hemorrhage. Cervicomedullary junction and pituitary are within
normal limits.

No chronic cerebral blood products identified. Scattered and patchy
bilateral cerebral white matter T2 and FLAIR hyperintensity. No
cortical encephalomalacia. The deep gray matter nuclei, brainstem,
and cerebellum appear within normal limits.

Vascular: Major intracranial vascular flow voids are preserved.

Skull and upper cervical spine: Partially visible cervical ACDF.
Normal bone marrow signal.

Sinuses/Orbits: Mildly Disconjugate gaze but otherwise negative
orbits. Paranasal sinuses and mastoids are stable and well
pneumatized.

Other: Visualized scalp soft tissues are within normal limits.
IMPRESSION: 1. The majority of the study was performed but the examination was
discontinued prior to completion by patient request.
2.  No acute intracranial abnormality identified.
3. Moderate for age cerebral white matter signal changes are
nonspecific but most commonly due to chronic small vessel disease.

## 2020-01-30 IMAGING — CT CT HEAD W/O CM
3 of 6 series · 13 of 47 positions shown, 15 images · non-contrast
Comparison: None.

CLINICAL DATA: Frontal headache, vomiting

EXAM:
CT HEAD WITHOUT CONTRAST
TECHNIQUE: Contiguous axial images were obtained from the base of the skull
through the vertex without intravenous contrast.

[Series 2: head wo · axial · 0.45mm/px · z∈[-135,-10]mm · 8 of 33 slices shown, 10 images]
[im 4/33  brain]
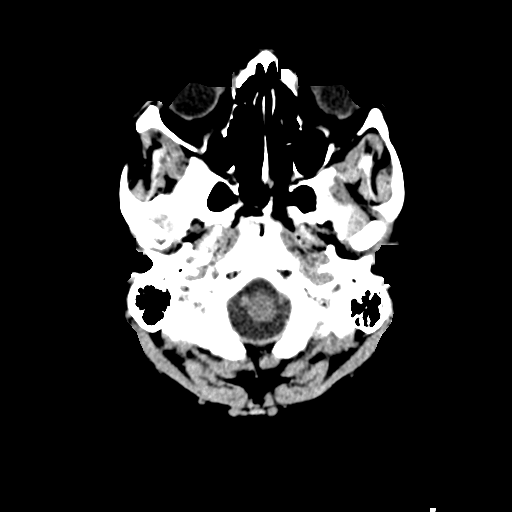
[im 4/33  bone]
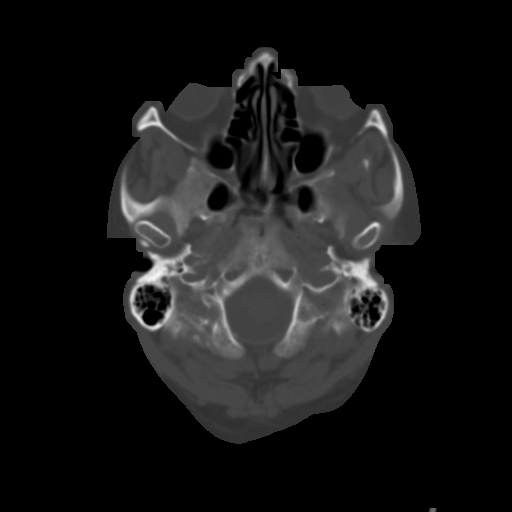
[im 7/33  brain]
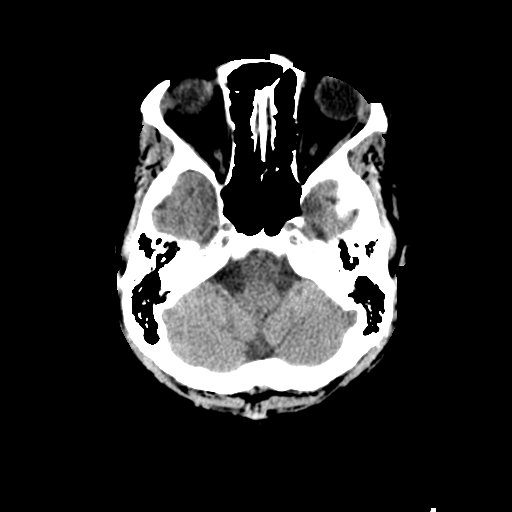
[im 11/33  brain]
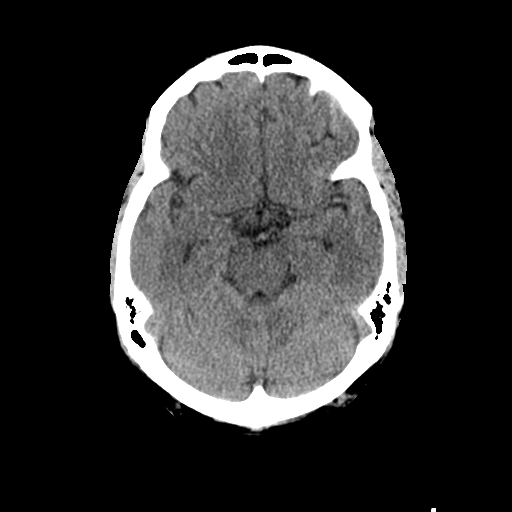
[im 14/33  brain]
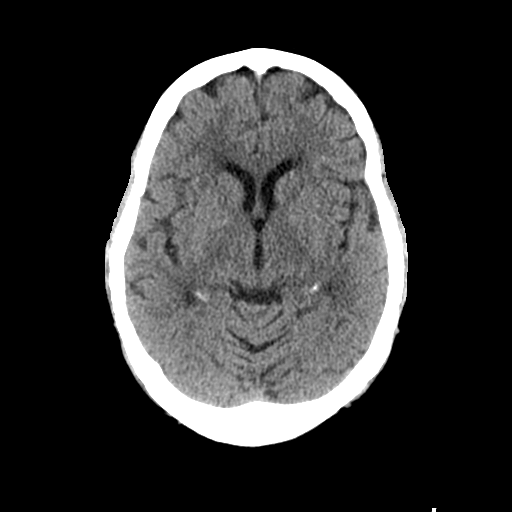
[im 19/33  brain]
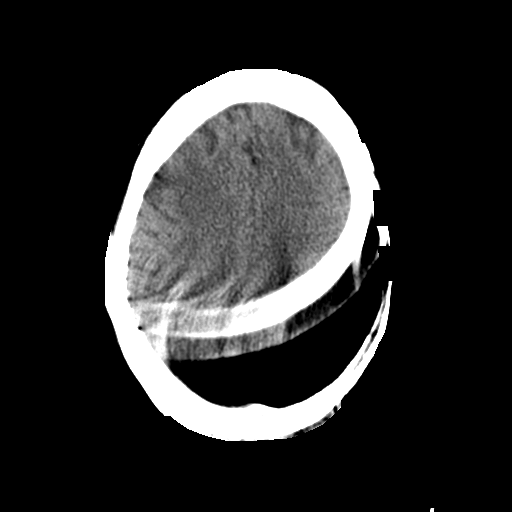
[im 19/33  bone]
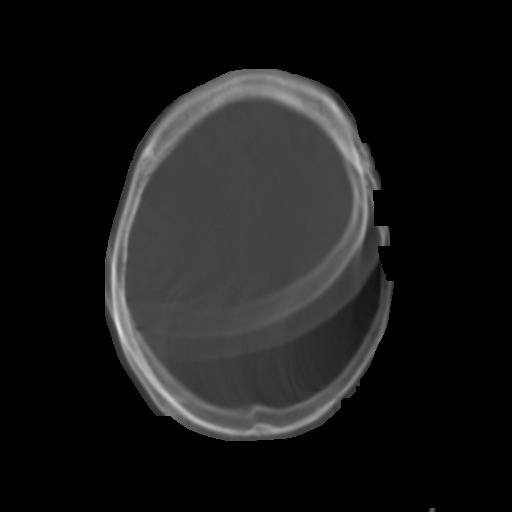
[im 22/33  brain]
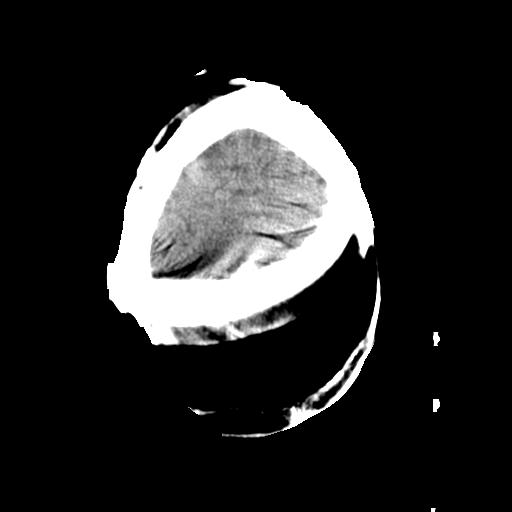
[im 26/33  brain]
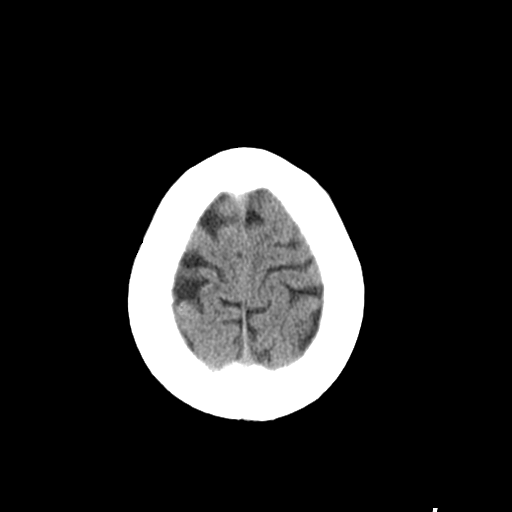
[im 29/33  brain]
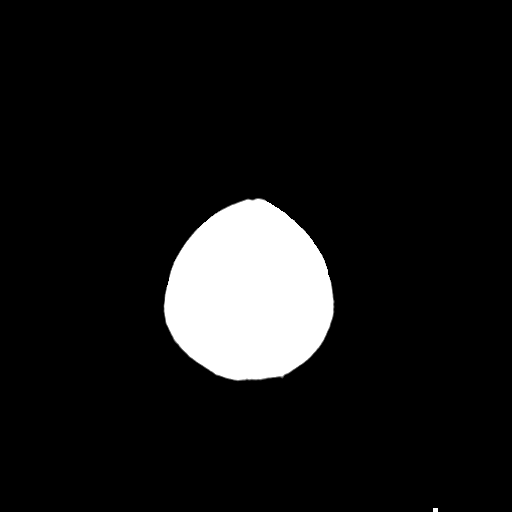

[Series 9: coronal soft tissue · coronal · 0.18mm/px · 3 of 64 slices shown]
[im 16/64  brain]
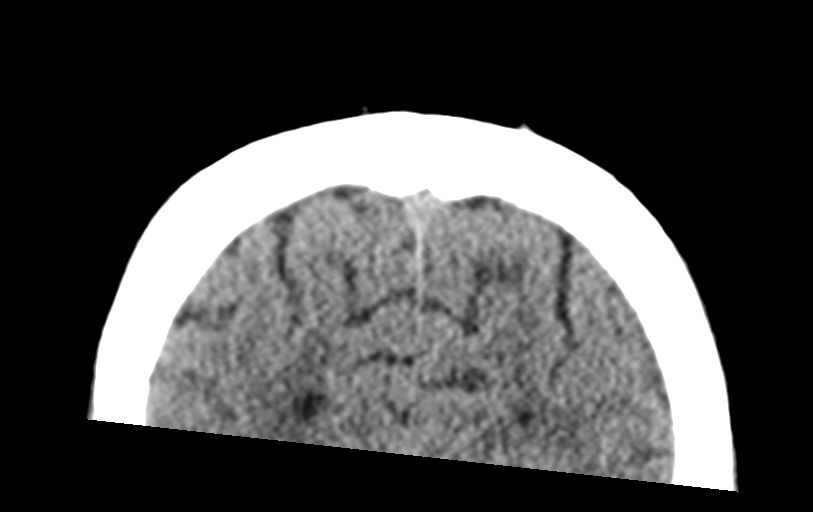
[im 32/64  brain]
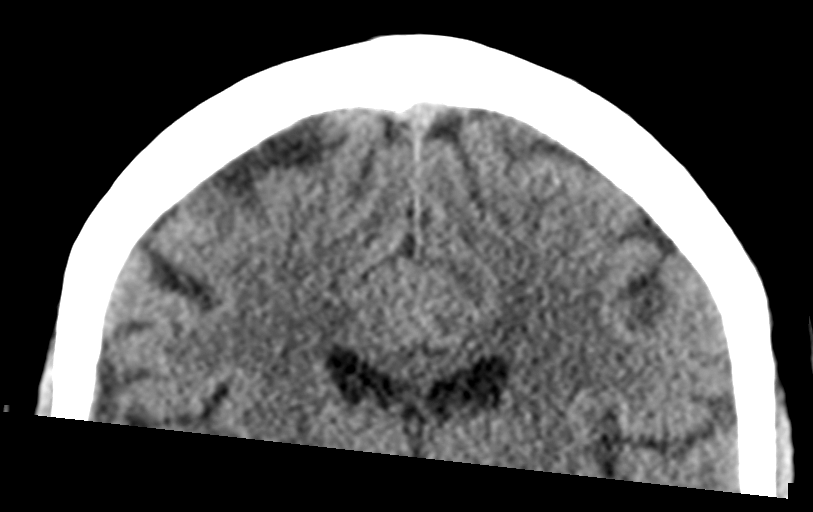
[im 48/64  brain]
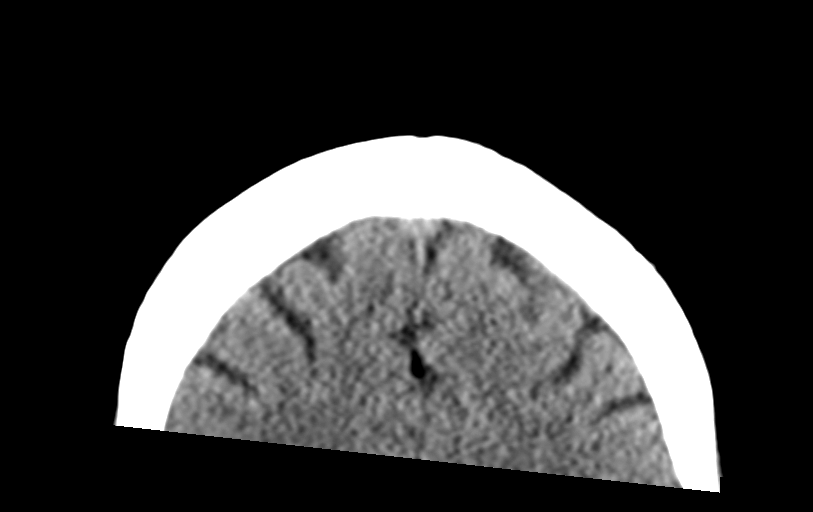

[Series 11: sagittal soft tissue · sagittal · 0.20mm/px · 2 of 49 slices shown]
[im 17/49  brain]
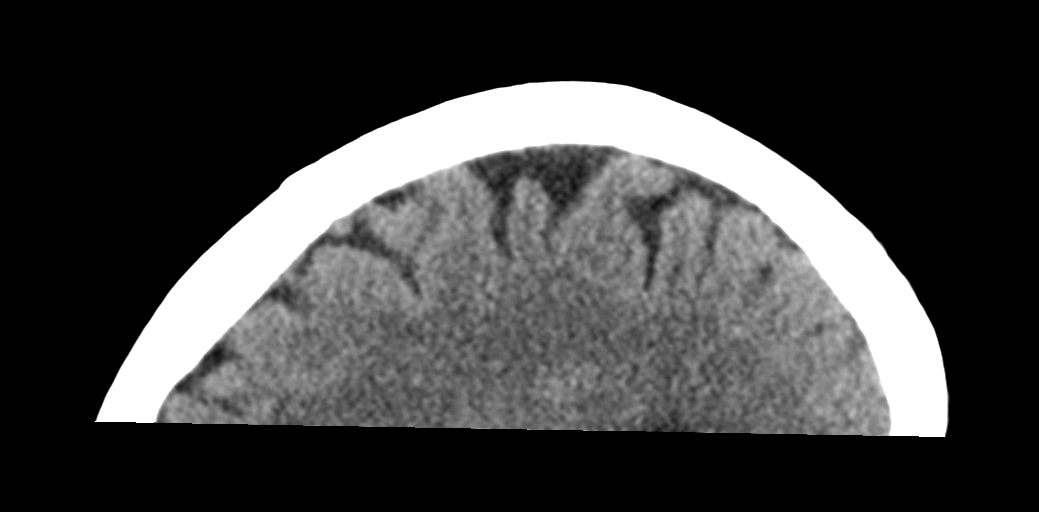
[im 33/49  brain]
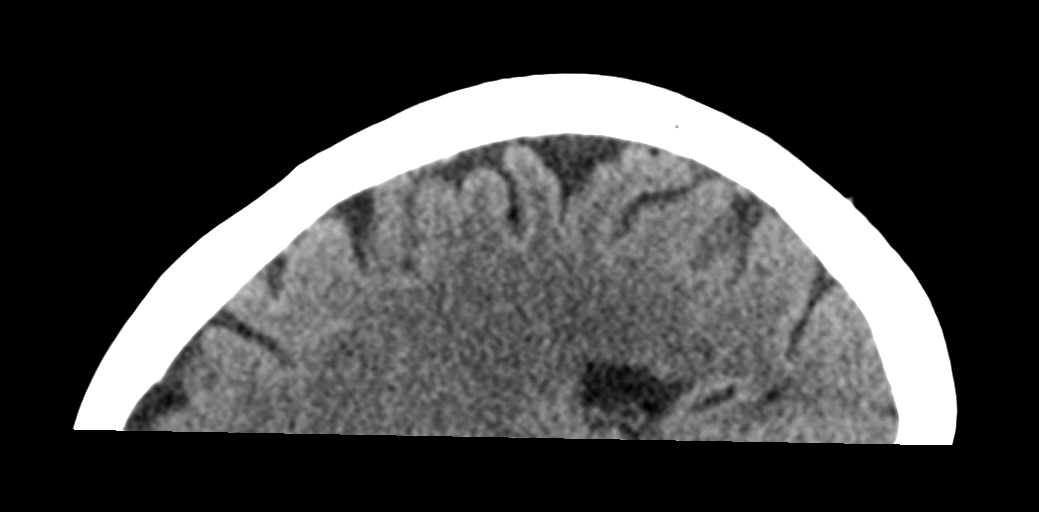

[13 of 47 positions shown; findings below may reference images not displayed]

FINDINGS: Brain: No evidence of acute infarction, hemorrhage, hydrocephalus,
extra-axial collection or mass lesion/mass effect.

Mild subcortical white matter and periventricular small vessel
ischemic changes.

Vascular: No hyperdense vessel or unexpected calcification.

Skull: Normal. Negative for fracture or focal lesion.

Sinuses/Orbits: The visualized paranasal sinuses are essentially
clear. The mastoid air cells are unopacified.

Other: None.
IMPRESSION: No evidence of acute intracranial abnormality. Mild small vessel
ischemic changes.

## 2021-01-23 ENCOUNTER — Other Ambulatory Visit: Payer: Self-pay | Admitting: Family Medicine

## 2021-01-23 DIAGNOSIS — Z1231 Encounter for screening mammogram for malignant neoplasm of breast: Secondary | ICD-10-CM

## 2021-01-25 ENCOUNTER — Ambulatory Visit: Payer: BC Managed Care – PPO

## 2021-03-21 ENCOUNTER — Ambulatory Visit
Admission: RE | Admit: 2021-03-21 | Discharge: 2021-03-21 | Disposition: A | Discharge status: Home / Self Care | Payer: BC Managed Care – PPO | Source: Ambulatory Visit | Attending: Family Medicine | Admitting: Family Medicine

## 2021-03-21 ENCOUNTER — Other Ambulatory Visit: Payer: Self-pay

## 2021-03-21 DIAGNOSIS — Z1231 Encounter for screening mammogram for malignant neoplasm of breast: Secondary | ICD-10-CM

## 2021-03-27 ENCOUNTER — Other Ambulatory Visit: Payer: Self-pay | Admitting: Orthopaedic Surgery

## 2021-03-27 DIAGNOSIS — M4722 Other spondylosis with radiculopathy, cervical region: Secondary | ICD-10-CM

## 2021-03-27 DIAGNOSIS — M4322 Fusion of spine, cervical region: Secondary | ICD-10-CM

## 2021-04-14 ENCOUNTER — Other Ambulatory Visit: Payer: Self-pay

## 2021-04-14 ENCOUNTER — Ambulatory Visit
Admission: RE | Admit: 2021-04-14 | Discharge: 2021-04-14 | Disposition: A | Discharge status: Home / Self Care | Payer: BC Managed Care – PPO | Source: Ambulatory Visit | Attending: Orthopaedic Surgery | Admitting: Orthopaedic Surgery

## 2021-04-14 DIAGNOSIS — M4722 Other spondylosis with radiculopathy, cervical region: Secondary | ICD-10-CM

## 2021-04-14 DIAGNOSIS — M4322 Fusion of spine, cervical region: Secondary | ICD-10-CM

## 2021-07-01 ENCOUNTER — Other Ambulatory Visit: Payer: Self-pay

## 2021-07-01 ENCOUNTER — Emergency Department (HOSPITAL_COMMUNITY)
Admission: EM | Admit: 2021-07-01 | Discharge: 2021-07-01 | Disposition: A | Discharge status: Home / Self Care | Payer: Medicare Other | Attending: Emergency Medicine | Admitting: Emergency Medicine

## 2021-07-01 ENCOUNTER — Encounter (HOSPITAL_COMMUNITY): Payer: Self-pay

## 2021-07-01 ENCOUNTER — Emergency Department (HOSPITAL_COMMUNITY): Payer: Medicare Other

## 2021-07-01 DIAGNOSIS — E119 Type 2 diabetes mellitus without complications: Secondary | ICD-10-CM | POA: Diagnosis not present

## 2021-07-01 DIAGNOSIS — J45909 Unspecified asthma, uncomplicated: Secondary | ICD-10-CM | POA: Diagnosis not present

## 2021-07-01 DIAGNOSIS — R1084 Generalized abdominal pain: Secondary | ICD-10-CM

## 2021-07-01 DIAGNOSIS — Z794 Long term (current) use of insulin: Secondary | ICD-10-CM | POA: Diagnosis not present

## 2021-07-01 DIAGNOSIS — I10 Essential (primary) hypertension: Secondary | ICD-10-CM | POA: Diagnosis not present

## 2021-07-01 DIAGNOSIS — Z7984 Long term (current) use of oral hypoglycemic drugs: Secondary | ICD-10-CM | POA: Insufficient documentation

## 2021-07-01 DIAGNOSIS — Z87891 Personal history of nicotine dependence: Secondary | ICD-10-CM | POA: Diagnosis not present

## 2021-07-01 DIAGNOSIS — Z79899 Other long term (current) drug therapy: Secondary | ICD-10-CM | POA: Diagnosis not present

## 2021-07-01 DIAGNOSIS — R1032 Left lower quadrant pain: Secondary | ICD-10-CM | POA: Diagnosis not present

## 2021-07-01 DIAGNOSIS — R112 Nausea with vomiting, unspecified: Secondary | ICD-10-CM | POA: Insufficient documentation

## 2021-07-01 DIAGNOSIS — R1012 Left upper quadrant pain: Secondary | ICD-10-CM | POA: Insufficient documentation

## 2021-07-01 DIAGNOSIS — R1031 Right lower quadrant pain: Secondary | ICD-10-CM | POA: Insufficient documentation

## 2021-07-01 LAB — HEPATIC FUNCTION PANEL
ALT: 17 U/L (ref 0–44)
AST: 17 U/L (ref 15–41)
Albumin: 4.3 g/dL (ref 3.5–5.0)
Alkaline Phosphatase: 130 U/L — ABNORMAL HIGH (ref 38–126)
Bilirubin, Direct: 0.1 mg/dL (ref 0.0–0.2)
Indirect Bilirubin: 0.6 mg/dL (ref 0.3–0.9)
Total Bilirubin: 0.7 mg/dL (ref 0.3–1.2)
Total Protein: 8 g/dL (ref 6.5–8.1)

## 2021-07-01 LAB — CBC WITH DIFFERENTIAL/PLATELET
Abs Immature Granulocytes: 0.01 10*3/uL (ref 0.00–0.07)
Basophils Absolute: 0 10*3/uL (ref 0.0–0.1)
Basophils Relative: 0 %
Eosinophils Absolute: 0.2 10*3/uL (ref 0.0–0.5)
Eosinophils Relative: 2 %
HCT: 34.3 % — ABNORMAL LOW (ref 36.0–46.0)
Hemoglobin: 10.8 g/dL — ABNORMAL LOW (ref 12.0–15.0)
Immature Granulocytes: 0 %
Lymphocytes Relative: 26 %
Lymphs Abs: 2.2 10*3/uL (ref 0.7–4.0)
MCH: 24.3 pg — ABNORMAL LOW (ref 26.0–34.0)
MCHC: 31.5 g/dL (ref 30.0–36.0)
MCV: 77.3 fL — ABNORMAL LOW (ref 80.0–100.0)
Monocytes Absolute: 0.4 10*3/uL (ref 0.1–1.0)
Monocytes Relative: 5 %
Neutro Abs: 5.7 10*3/uL (ref 1.7–7.7)
Neutrophils Relative %: 67 %
Platelets: 282 10*3/uL (ref 150–400)
RBC: 4.44 MIL/uL (ref 3.87–5.11)
RDW: 13 % (ref 11.5–15.5)
WBC: 8.5 10*3/uL (ref 4.0–10.5)
nRBC: 0 % (ref 0.0–0.2)

## 2021-07-01 LAB — URINALYSIS, ROUTINE W REFLEX MICROSCOPIC
Bilirubin Urine: NEGATIVE
Glucose, UA: 150 mg/dL — AB
Hgb urine dipstick: NEGATIVE
Ketones, ur: NEGATIVE mg/dL
Leukocytes,Ua: NEGATIVE
Nitrite: NEGATIVE
Protein, ur: 30 mg/dL — AB
Specific Gravity, Urine: 1.023 (ref 1.005–1.030)
pH: 7 (ref 5.0–8.0)

## 2021-07-01 LAB — BASIC METABOLIC PANEL
Anion gap: 10 (ref 5–15)
BUN: 10 mg/dL (ref 8–23)
CO2: 22 mmol/L (ref 22–32)
Calcium: 9.5 mg/dL (ref 8.9–10.3)
Chloride: 102 mmol/L (ref 98–111)
Creatinine, Ser: 0.67 mg/dL (ref 0.44–1.00)
GFR, Estimated: >60 mL/min (ref >60)
Glucose, Bld: 270 mg/dL — ABNORMAL HIGH (ref 70–99)
Potassium: 4.2 mmol/L (ref 3.5–5.1)
Sodium: 134 mmol/L — ABNORMAL LOW (ref 135–145)

## 2021-07-01 LAB — LIPASE, BLOOD: Lipase: 46 U/L (ref 11–51)

## 2021-07-01 MED ORDER — KETOROLAC TROMETHAMINE 15 MG/ML IJ SOLN: 15.0000 mg | Freq: Once | INTRAMUSCULAR | Status: DC

## 2021-07-01 MED ORDER — ONDANSETRON HCL 4 MG PO TABS: 4.0000 mg | ORAL_TABLET | Freq: Four times a day (QID) | ORAL | 0 refills | Status: AC

## 2021-07-01 MED ORDER — IOHEXOL 350 MG/ML SOLN
80.0000 mL | Freq: Once | INTRAVENOUS | Status: AC | PRN
  Administered 2021-07-01: 80 mL via INTRAVENOUS

## 2021-07-01 MED ORDER — FENTANYL CITRATE PF 50 MCG/ML IJ SOSY
50.0000 ug | PREFILLED_SYRINGE | Freq: Once | INTRAMUSCULAR | Status: DC
  Filled 2021-07-01: qty 1

## 2021-07-01 MED ORDER — POLYETHYLENE GLYCOL 3350 17 G PO PACK: 17.0000 g | PACK | Freq: Every day | ORAL | 0 refills | Status: AC

## 2021-07-01 MED ORDER — ONDANSETRON HCL 4 MG/2ML IJ SOLN
4.0000 mg | Freq: Once | INTRAMUSCULAR | Status: AC
  Administered 2021-07-01: 4 mg via INTRAVENOUS
  Filled 2021-07-01: qty 2

## 2021-07-01 MED ORDER — SODIUM CHLORIDE 0.9 % IV BOLUS
1000.0000 mL | Freq: Once | INTRAVENOUS | Status: AC
  Administered 2021-07-01: 1000 mL via INTRAVENOUS

## 2021-07-01 NOTE — ED Triage Notes (Addendum)
Pt. C/o bilateral flank pain and abdominal pain that started around 8:30. 06/30/2021.

## 2021-07-01 NOTE — ED Provider Notes (Signed)
[facility] DEPT Provider Note   CSN: [id number] Arrival date & time: 07/01/21  4098     History Chief Complaint  Patient presents with   Abdominal Pain    Cassandra Gould is a 65 y.o. female.  The history is provided by the patient.  Abdominal Pain Pain location:  LUQ, LLQ, L flank, suprapubic and RLQ Pain quality: aching   Pain radiates to:  Does not radiate Pain severity:  Moderate Onset quality:  Gradual Duration:  2 days Timing:  Constant Progression:  Unchanged Chronicity:  New Context: previous surgery   Relieved by:  Nothing Worsened by:  Nothing Associated symptoms: nausea and vomiting   Associated symptoms: no anorexia, no belching, no chest pain, no chills, no constipation, no cough, no diarrhea, no dysuria, no fatigue, no fever, no hematuria, no shortness of breath, no sore throat, no vaginal bleeding and no vaginal discharge   Risk factors: multiple surgeries       Past Medical History:  Diagnosis Date   Asthma    bronchites   Bronchitis    Diabetes mellitus    Headache    Hypertension     Patient Active Problem List   Diagnosis Date Noted   Headache 08/02/2018   Weakness 08/01/2018   Intractable headache 08/01/2018   Acute perforated appendicitis s/p lap appendectomy 08/31/2016 08/31/2016   Seasonal allergies 08/31/2016   Obesity (BMI 30-39.9) 08/31/2016   Migraines 08/31/2016   Hypertension    Type 2 diabetes mellitus without complication, with long-term current use of insulin ([place])    Asthma     Past Surgical History:  Procedure Laterality Date   LAPAROSCOPIC APPENDECTOMY N/A 08/31/2016   Procedure: APPENDECTOMY LAPAROSCOPIC;  Surgeon: [name], MD;  Location: [facility];  Service: General;  Laterality: N/A;   TUBAL LIGATION       OB History   No obstetric history on file.     Family History  Problem Relation Age of Onset   Hypertension Mother    Diabetes Mother    Diabetes Sister     Social  History   Tobacco Use   Smoking status: Former   Smokeless tobacco: Never  Scientific laboratory technician Use: Never used  Substance Use Topics   Alcohol use: No   Drug use: No    Home Medications Prior to Admission medications   Medication Sig Start Date End Date Taking? Authorizing Provider  ondansetron (ZOFRAN) 4 MG tablet Take 1 tablet (4 mg total) by mouth every 6 (six) hours. 07/01/21  Yes Ellery Tash, DO  polyethylene glycol (MIRALAX / GLYCOLAX) 17 g packet Take 17 g by mouth daily. 07/01/21  Yes Seamus Warehime, DO  acetaminophen (TYLENOL) 325 MG tablet Take 2 tablets (650 mg total) by mouth every 6 (six) hours as needed for headache (or Fever >/= 101). 08/05/18   Shelly Coss, MD  amLODipine (NORVASC) 10 MG tablet Take 1 tablet (10 mg total) by mouth daily. 08/06/18   Shelly Coss, MD  atorvastatin (LIPITOR) 40 MG tablet Take 1 tablet (40 mg total) by mouth daily. Patient not taking: Reported on 08/01/2018 07/27/18   Clent Demark, PA-C  blood glucose meter kit and supplies KIT Dispense based on patient and insurance preference. Use up to four times daily as directed. (FOR ICD-9 250.00, 250.01). 12/26/16   Clent Demark, PA-C  gabapentin (NEURONTIN) 300 MG capsule Take 2 capsules (600 mg total) by mouth 3 (three) times daily. 07/27/18   Domenica Fail  Shanon Brow, PA-C  glimepiride (AMARYL) 4 MG tablet Take 4 mg by mouth daily with breakfast.    [provider]  hydrALAZINE (APRESOLINE) 25 MG tablet Take 1 tablet (25 mg total) by mouth 3 (three) times daily. 08/05/18 09/04/18  Shelly Coss, MD  hydrochlorothiazide (HYDRODIURIL) 25 MG tablet Take 1 tablet (25 mg total) by mouth daily. 08/06/18   Shelly Coss, MD  Insulin Detemir (LEVEMIR FLEXTOUCH) 100 UNIT/ML Pen Inject 35 Units into the skin daily at 10 pm. 07/27/18   Clent Demark, PA-C  Insulin Pen Needle (BD PEN NEEDLE MICRO U/F) 32G X 6 MM MISC 1 each by Does not apply route at bedtime. 07/27/18   Clent Demark, PA-C  lisinopril (PRINIVIL,ZESTRIL) 40 MG tablet Take 1 tablet (40 mg total) by mouth daily. 08/06/18   Shelly Coss, MD  metFORMIN (GLUCOPHAGE) 1000 MG tablet TAKE 1 TABLET BY MOUTH TWICE DAILY WITH MEALS Patient taking differently: Take 500 mg by mouth 2 (two) times daily with a meal.  07/27/18   Clent Demark, PA-C  neomycin-bacitracin-polymyxin (NEOSPORIN) ointment Apply 1 application topically every 12 (twelve) hours. Patient not taking: Reported on 08/01/2018 07/27/18   Clent Demark, PA-C  sitaGLIPtin (JANUVIA) 100 MG tablet Take 1 tablet (100 mg total) by mouth daily. 07/27/18   Clent Demark, PA-C    Allergies    Oxycodone  Review of Systems   Review of Systems  Constitutional:  Negative for chills, fatigue and fever.  HENT:  Negative for ear pain and sore throat.   Eyes:  Negative for pain and visual disturbance.  Respiratory:  Negative for cough and shortness of breath.   Cardiovascular:  Negative for chest pain and palpitations.  Gastrointestinal:  Positive for abdominal pain, nausea and vomiting. Negative for anorexia, constipation and diarrhea.  Genitourinary:  Negative for dysuria, hematuria, vaginal bleeding and vaginal discharge.  Musculoskeletal:  Negative for arthralgias and back pain.  Skin:  Negative for color change and rash.  Neurological:  Negative for seizures and syncope.  All other systems reviewed and are negative.  Physical Exam Updated Vital Signs BP (!) 158/73 (BP Location: Right Arm)   Pulse 81   Temp (!) 97.4 F (36.3 C) (Oral)   Resp 16   SpO2 97%   Physical Exam Vitals and nursing note reviewed.  Constitutional:      General: She is not in acute distress.    Appearance: She is well-developed. She is not ill-appearing.  HENT:     Head: Normocephalic and atraumatic.     Mouth/Throat:     Mouth: Mucous membranes are moist.     Pharynx: Oropharynx is clear.  Eyes:     Extraocular Movements: Extraocular  movements intact.     Conjunctiva/sclera: Conjunctivae normal.     Pupils: Pupils are equal, round, and reactive to light.  Cardiovascular:     Rate and Rhythm: Normal rate and regular rhythm.     Heart sounds: Normal heart sounds. No murmur heard. Pulmonary:     Effort: Pulmonary effort is normal. No respiratory distress.     Breath sounds: Normal breath sounds.  Abdominal:     General: Abdomen is flat.     Palpations: Abdomen is soft.     Tenderness: There is abdominal tenderness in the right lower quadrant, suprapubic area, left upper quadrant and left lower quadrant.     Hernia: No hernia is present.  Genitourinary:    Rectum: Normal.  Musculoskeletal:  Cervical back: Neck supple.  Skin:    General: Skin is warm and dry.     Capillary Refill: Capillary refill takes less than 2 seconds.  Neurological:     General: No focal deficit present.     Mental Status: She is alert.  Psychiatric:        Mood and Affect: Mood normal.    ED Results / Procedures / Treatments   Labs (all labs ordered are listed, but only abnormal results are displayed) Labs Reviewed  CBC WITH DIFFERENTIAL/PLATELET - Abnormal; Notable for the following components:      Result Value   Hemoglobin 10.8 (*)    HCT 34.3 (*)    MCV 77.3 (*)    MCH 24.3 (*)    All other components within normal limits  BASIC METABOLIC PANEL - Abnormal; Notable for the following components:   Sodium 134 (*)    Glucose, Bld 270 (*)    All other components within normal limits  HEPATIC FUNCTION PANEL - Abnormal; Notable for the following components:   Alkaline Phosphatase 130 (*)    All other components within normal limits  URINALYSIS, ROUTINE W REFLEX MICROSCOPIC - Abnormal; Notable for the following components:   Glucose, UA 150 (*)    Protein, ur 30 (*)    Bacteria, UA RARE (*)    All other components within normal limits  URINE CULTURE  LIPASE, BLOOD    EKG None  Radiology CT ABDOMEN PELVIS W  CONTRAST  Result Date: 07/01/2021 CLINICAL DATA:  Diverticulitis suspected. Bowel obstruction suspected. EXAM: CT ABDOMEN AND PELVIS WITH CONTRAST TECHNIQUE: Multidetector CT imaging of the abdomen and pelvis was performed using the standard protocol following bolus administration of intravenous contrast. CONTRAST:  8m OMNIPAQUE IOHEXOL 350 MG/ML SOLN COMPARISON:  08/30/2016 FINDINGS: Lower chest: Lung bases are clear. Hepatobiliary: Normal appearance of the liver, gallbladder and portal venous system. Stable 9 mm low-density near the dome probably represents a cyst or incidental finding. Pancreas: Unremarkable. No pancreatic ductal dilatation or surrounding inflammatory changes. Spleen: Normal in size without focal abnormality. Adrenals/Urinary Tract: Normal adrenal glands. Normal appearance of both kidneys without hydronephrosis. No suspicious renal lesions. Normal appearance of the urinary bladder. Stomach/Bowel: Normal stomach. No evidence for bowel distension or obstruction. No significant colonic diverticula. No evidence for bowel inflammation. Appendectomy. Vascular/Lymphatic: Aortic atherosclerosis. No enlarged abdominal or pelvic lymph nodes. Reproductive: Again noted is a large calcified fibroid. No evidence for adnexal mass or lesion. Other: Negative for free fluid.  Negative for free air. Musculoskeletal: Posterior interbody fusion at L4, L5 and S1. IMPRESSION: 1. No acute abnormality in the abdomen or pelvis. Specifically, no evidence for diverticulitis. 2.  Aortic Atherosclerosis (ICD10-I70.0). Electronically Signed   By: AMarkus DaftM.D.   On: 07/01/2021 12:00    Procedures Procedures   Medications Ordered in ED Medications  fentaNYL (SUBLIMAZE) injection 50 mcg (50 mcg Intravenous Not Given 07/01/21 1044)  ketorolac (TORADOL) 15 MG/ML injection 15 mg (has no administration in time range)  sodium chloride 0.9 % bolus 1,000 mL (1,000 mLs Intravenous New Bag/Given 07/01/21 1049)   ondansetron (ZOFRAN) injection 4 mg (4 mg Intravenous Given 07/01/21 1047)  iohexol (OMNIPAQUE) 350 MG/ML injection 80 mL (80 mLs Intravenous Contrast Given 07/01/21 1110)    ED Course  I have reviewed the triage vital signs and the nursing notes.  Pertinent labs & imaging results that were available during my care of the patient were reviewed by me and considered in my medical decision  making (see chart for details).    MDM Rules/Calculators/A&P                           Marylan Glore is a 65 year old female history of diabetes, hypertension who presents the ED with lower abdominal pain.  Has had nausea and vomiting.  Not passing gas.  Overall unremarkable vitals.  No fever.  Pain since last night.  Differential includes bowel obstruction versus UTI versus diverticulitis/colitis.  She has had prior appendectomy.  We will get basic labs including treat nausea, vomiting.  We will get a CT scan abdomen pelvis as well.  Lab work overall unremarkable.  No significant anemia.  Blood sugar mildly elevated but not in DKA.  CT scan showed no acute findings.  Suspect may be some gas/constipation related pain.  Will prescribe MiraLAX and Zofran.  Overall no bowel obstruction or other acute intra-abdominal process.  Discharged in good condition.  Understands return precautions.  This chart was dictated using voice recognition software.  Despite best efforts to proofread,  errors can occur which can change the documentation meaning.   Final Clinical Impression(s) / ED Diagnoses Final diagnoses:  Generalized abdominal pain    Rx / DC Orders ED Discharge Orders          Ordered    polyethylene glycol (MIRALAX / GLYCOLAX) 17 g packet  Daily        07/01/21 1224    ondansetron (ZOFRAN) 4 MG tablet  Every 6 hours        07/01/21 Evaro, Quita Skye, DO 07/01/21 1225

## 2021-07-02 LAB — URINE CULTURE: Culture: 20000 — AB

## 2021-07-03 ENCOUNTER — Telehealth: Payer: Self-pay

## 2021-07-03 NOTE — Progress Notes (Signed)
ED Antimicrobial Stewardship Positive Culture Follow Up   Cassandra Gould is an 65 y.o. female who presented to [facility] on 07/01/2021 with a chief complaint of  Chief Complaint  Patient presents with   Abdominal Pain    Recent Results (from the past 720 hour(s))  Urine Culture     Status: Abnormal   Collection Time: 07/01/21  9:48 AM   Specimen: Urine, Clean Catch  Result Value Ref Range Status   Specimen Description   Final    URINE, CLEAN CATCH Performed at [facility], [address] [place], [place] [address]    Special Requests   Final    NONE Performed at [facility], [address], [place], [place] [address]    Culture (A)  Final    20,000 COLONIES/mL STREPTOCOCCUS AGALACTIAE TESTING AGAINST S. AGALACTIAE NOT ROUTINELY PERFORMED DUE TO PREDICTABILITY OF AMP/PEN/VAN SUSCEPTIBILITY. Performed at [facility], [address], [place], [place] [address]    Report Status 07/02/2021 FINAL  Final   89 YOF presenting with abdominal pain and N/V. Believed to be expected by gas/constipation. Pt reported no urinary sx so determined to be asymptomatic bacteriuria.   Plan: No tx necessary  ED Provider: Linwood Dibbles, MD  [name], Student-PharmD  07/03/2021, 12:20 PM

## 2021-07-03 NOTE — Telephone Encounter (Signed)
Post ED Visit - Positive Culture Follow-up  Culture report reviewed by antimicrobial stewardship pharmacist: [facility] []  , Pharm.D. []  Enzo Bi, Pharm.D., BCPS AQ-ID []  , Pharm.D., BCPS []  Celedonio Miyamoto, Pharm.D., BCPS []  [place]ain[place], Garvin Fila.D., BCPS, AAHIVP []  , Pharm.D., BCPS, AAHIVP []  Georgina Pillion, PharmD, BCPS []  , PharmD, BCPS []  [place], PharmD, BCPS []  [address], PharmD []  , PharmD, BCPS []  Estella Husk, PharmD  Pharmacy Team []  Lysle Pearl, PharmD []  , PharmD []  Phillips Climes, PharmD []  , Rph []  Agapito Games) , PharmD []  Verlan Friends, PharmD []  , PharmD []  Mervyn Gay, PharmD []  , PharmD []  Vinnie Level, PharmD []  [facility], PharmD []  , PharmD []  Len Childs, PharmD  Reviewed by ED Provider: , MD Positive positive culture No treatment necessary. No further patient follow-up is required at this time.  Greer Pickerel 07/03/2021, 10:46 AM

## 2021-07-28 ENCOUNTER — Emergency Department (HOSPITAL_COMMUNITY): Payer: Medicare HMO

## 2021-07-28 ENCOUNTER — Encounter (HOSPITAL_COMMUNITY): Payer: Self-pay | Admitting: Emergency Medicine

## 2021-07-28 ENCOUNTER — Emergency Department (HOSPITAL_COMMUNITY)
Admission: EM | Admit: 2021-07-28 | Discharge: 2021-07-28 | Disposition: A | Discharge status: Home / Self Care | Payer: Medicare HMO | Attending: Emergency Medicine | Admitting: Emergency Medicine

## 2021-07-28 DIAGNOSIS — S93402A Sprain of unspecified ligament of left ankle, initial encounter: Secondary | ICD-10-CM | POA: Diagnosis not present

## 2021-07-28 DIAGNOSIS — E119 Type 2 diabetes mellitus without complications: Secondary | ICD-10-CM | POA: Diagnosis not present

## 2021-07-28 DIAGNOSIS — W19XXXA Unspecified fall, initial encounter: Secondary | ICD-10-CM | POA: Insufficient documentation

## 2021-07-28 DIAGNOSIS — Z87891 Personal history of nicotine dependence: Secondary | ICD-10-CM | POA: Diagnosis not present

## 2021-07-28 DIAGNOSIS — Z794 Long term (current) use of insulin: Secondary | ICD-10-CM | POA: Diagnosis not present

## 2021-07-28 DIAGNOSIS — Z79899 Other long term (current) drug therapy: Secondary | ICD-10-CM | POA: Diagnosis not present

## 2021-07-28 DIAGNOSIS — J45909 Unspecified asthma, uncomplicated: Secondary | ICD-10-CM | POA: Insufficient documentation

## 2021-07-28 DIAGNOSIS — S99912A Unspecified injury of left ankle, initial encounter: Secondary | ICD-10-CM | POA: Diagnosis present

## 2021-07-28 DIAGNOSIS — Z7984 Long term (current) use of oral hypoglycemic drugs: Secondary | ICD-10-CM | POA: Diagnosis not present

## 2021-07-28 DIAGNOSIS — I10 Essential (primary) hypertension: Secondary | ICD-10-CM | POA: Diagnosis not present

## 2021-07-28 MED ORDER — IBUPROFEN 600 MG PO TABS: 600.0000 mg | ORAL_TABLET | Freq: Four times a day (QID) | ORAL | 0 refills | Status: AC | PRN

## 2021-07-28 NOTE — ED Provider Notes (Signed)
[facility] DEPT Provider Note   CSN: [id number] Arrival date & time: 07/28/21  1127     History Chief Complaint  Patient presents with   Cassandra Gould is a 65 y.o. female presents to the emergency department for evaluation of left ankle pain after a mechanical fall yesterday.  Patient reports yesterday around 1630, the patient was taking a step when she inverted her left ankle falling.  She reports she hit her head on the concrete, but denies any head pain or loss of consciousness.  Denies any weakness, blurry vision, tinnitus, or lightheadedness.  She denies any blood thinner use.  The patient has no other worsening pain, other than her chronic pain.  The patient has been able to bear weight on the ankle, with pain.  Denies any numbness or tingling to the area.  Medical history pertinent for diabetes, hypertension, and asthma.  Patient reports back and neck surgery.  Allergic to oxycodone, the patient reports she has taken Tylenol in the past.  Denies any smoking, EtOH, or drug use.   Fall Pertinent negatives include no chest pain, no abdominal pain, no headaches and no shortness of breath.      Past Medical History:  Diagnosis Date   Asthma    bronchites   Bronchitis    Diabetes mellitus    Headache    Hypertension     Patient Active Problem List   Diagnosis Date Noted   Headache 08/02/2018   Weakness 08/01/2018   Intractable headache 08/01/2018   Acute perforated appendicitis s/p lap appendectomy 08/31/2016 08/31/2016   Seasonal allergies 08/31/2016   Obesity (BMI 30-39.9) 08/31/2016   Migraines 08/31/2016   Hypertension    Type 2 diabetes mellitus without complication, with long-term current use of insulin ([place])    Asthma     Past Surgical History:  Procedure Laterality Date   LAPAROSCOPIC APPENDECTOMY N/A 08/31/2016   Procedure: APPENDECTOMY LAPAROSCOPIC;  Surgeon: [name], MD;  Location: [facility] ORS;  Service: General;   Laterality: N/A;   TUBAL LIGATION       OB History   No obstetric history on file.     Family History  Problem Relation Age of Onset   Hypertension Mother    Diabetes Mother    Diabetes Sister     Social History   Tobacco Use   Smoking status: Former   Smokeless tobacco: Never  Scientific laboratory technician Use: Never used  Substance Use Topics   Alcohol use: No   Drug use: No    Home Medications Prior to Admission medications   Medication Sig Start Date End Date Taking? Authorizing Provider  ibuprofen (ADVIL) 600 MG tablet Take 1 tablet (600 mg total) by mouth every 6 (six) hours as needed. 07/28/21  Yes Sherrell Puller, PA-C  acetaminophen (TYLENOL) 325 MG tablet Take 2 tablets (650 mg total) by mouth every 6 (six) hours as needed for headache (or Fever >/= 101). 08/05/18   Shelly Coss, MD  amLODipine (NORVASC) 10 MG tablet Take 1 tablet (10 mg total) by mouth daily. 08/06/18   Shelly Coss, MD  atorvastatin (LIPITOR) 40 MG tablet Take 1 tablet (40 mg total) by mouth daily. Patient not taking: Reported on 08/01/2018 07/27/18   Clent Demark, PA-C  blood glucose meter kit and supplies KIT Dispense based on patient and insurance preference. Use up to four times daily as directed. (FOR ICD-9 250.00, 250.01). 12/26/16   Clent Demark, PA-C  gabapentin (NEURONTIN) 300 MG capsule Take 2 capsules (600 mg total) by mouth 3 (three) times daily. 07/27/18   Clent Demark, PA-C  glimepiride (AMARYL) 4 MG tablet Take 4 mg by mouth daily with breakfast.    [provider]  hydrALAZINE (APRESOLINE) 25 MG tablet Take 1 tablet (25 mg total) by mouth 3 (three) times daily. 08/05/18 09/04/18  Shelly Coss, MD  hydrochlorothiazide (HYDRODIURIL) 25 MG tablet Take 1 tablet (25 mg total) by mouth daily. 08/06/18   Shelly Coss, MD  Insulin Detemir (LEVEMIR FLEXTOUCH) 100 UNIT/ML Pen Inject 35 Units into the skin daily at 10 pm. 07/27/18   Clent Demark, PA-C  Insulin  Pen Needle (BD PEN NEEDLE MICRO U/F) 32G X 6 MM MISC 1 each by Does not apply route at bedtime. 07/27/18   Clent Demark, PA-C  lisinopril (PRINIVIL,ZESTRIL) 40 MG tablet Take 1 tablet (40 mg total) by mouth daily. 08/06/18   Shelly Coss, MD  metFORMIN (GLUCOPHAGE) 1000 MG tablet TAKE 1 TABLET BY MOUTH TWICE DAILY WITH MEALS Patient taking differently: Take 500 mg by mouth 2 (two) times daily with a meal.  07/27/18   Clent Demark, PA-C  neomycin-bacitracin-polymyxin (NEOSPORIN) ointment Apply 1 application topically every 12 (twelve) hours. Patient not taking: Reported on 08/01/2018 07/27/18   Clent Demark, PA-C  ondansetron (ZOFRAN) 4 MG tablet Take 1 tablet (4 mg total) by mouth every 6 (six) hours. 07/01/21   Curatolo, Adam, DO  polyethylene glycol (MIRALAX / GLYCOLAX) 17 g packet Take 17 g by mouth daily. 07/01/21   Curatolo, Adam, DO  sitaGLIPtin (JANUVIA) 100 MG tablet Take 1 tablet (100 mg total) by mouth daily. 07/27/18   Clent Demark, PA-C    Allergies    Oxycodone  Review of Systems   Review of Systems  Constitutional:  Negative for chills and fever.  HENT:  Negative for ear pain and sore throat.   Eyes:  Negative for pain and visual disturbance.  Respiratory:  Negative for cough and shortness of breath.   Cardiovascular:  Negative for chest pain and palpitations.  Gastrointestinal:  Negative for abdominal pain and vomiting.  Genitourinary:  Negative for dysuria and hematuria.  Musculoskeletal:  Positive for arthralgias. Negative for back pain, joint swelling, myalgias, neck pain and neck stiffness.  Skin:  Negative for color change and rash.  Neurological:  Negative for dizziness, seizures, syncope, weakness, light-headedness, numbness and headaches.  All other systems reviewed and are negative.  Physical Exam Updated Vital Signs BP (!) 161/82   Pulse 79   Temp 98.2 F (36.8 C) (Oral)   Resp 17   SpO2 99%   Physical Exam Vitals and nursing  note reviewed.  Constitutional:      Appearance: Normal appearance.  HENT:     Head: Normocephalic and atraumatic.  Eyes:     General: No scleral icterus.    Extraocular Movements: Extraocular movements intact.  Cardiovascular:     Rate and Rhythm: Normal rate and regular rhythm.  Pulmonary:     Effort: Pulmonary effort is normal.     Breath sounds: Normal breath sounds.  Abdominal:     General: Bowel sounds are normal.     Palpations: Abdomen is soft.  Musculoskeletal:        General: Tenderness present. No swelling or deformity. Normal range of motion.     Cervical back: Normal range of motion.     Comments: Lateral malleoli tenderness and lateral left foot tenderness to  palpation. Sensation intact.  Compartments soft.  No obvious deformities or step-offs.  No overlying skin changes, ecchymosis, erythema, or rash noted.  No swelling.  No midline or paraspinal cervical, thoracic, lumbar, or sacral tenderness palpation.   Skin:    General: Skin is warm and dry.  Neurological:     General: No focal deficit present.     Mental Status: She is alert. Mental status is at baseline.     Sensory: No sensory deficit.     Motor: No weakness.    ED Results / Procedures / Treatments   Labs (all labs ordered are listed, but only abnormal results are displayed) Labs Reviewed - No data to display  EKG None  Radiology DG Ankle Complete Left  Result Date: 07/28/2021 CLINICAL DATA:  fall EXAM: LEFT ANKLE COMPLETE - 3+ VIEW; LEFT FOOT - COMPLETE 3+ VIEW COMPARISON:  None. FINDINGS: Osteopenia. No acute fracture or dislocation. Joint spaces and alignment are maintained. No area of erosion or osseous destruction. No unexpected radiopaque foreign body. Soft tissues are unremarkable. IMPRESSION: No acute fracture or dislocation. Electronically Signed   By: Valentino Saxon M.D.   On: 07/28/2021 12:24   DG Foot Complete Left  Result Date: 07/28/2021 CLINICAL DATA:  fall EXAM: LEFT ANKLE  COMPLETE - 3+ VIEW; LEFT FOOT - COMPLETE 3+ VIEW COMPARISON:  None. FINDINGS: Osteopenia. No acute fracture or dislocation. Joint spaces and alignment are maintained. No area of erosion or osseous destruction. No unexpected radiopaque foreign body. Soft tissues are unremarkable. IMPRESSION: No acute fracture or dislocation. Electronically Signed   By: Valentino Saxon M.D.   On: 07/28/2021 12:24    Procedures Procedures   Medications Ordered in ED Medications - No data to display  ED Course  I have reviewed the triage vital signs and the nursing notes.  Pertinent labs & imaging results that were available during my care of the patient were reviewed by me and considered in my medical decision making (see chart for details).  65 year old female presents emergency department for evaluation of her left ankle pain after fall.  X-ray of her left foot and ankle are benign other than some osteopenia visualized.  No acute fracture or dislocation.  Soft tissues are unremarkable.  After discussion with the patient of her other aches and pains from falling, she reports she has no worsening than her chronic pain in her back and neck.  Nontender to palpation.  Additionally, the patient reports some left wrist pain, but declines imaging and reports she will come back if her pain worsens.  Declined CT scanning as she reports she does not feel dizzy or lightheaded.  She declines she is experiencing any blurry vision.  Reports she is mainly concerned for her ankle.  She discussed with me that she will return to the emergency department if anything changes.  I discussed with the patient the imaging findings.  Reported there is no fracture or break.  Recommended the RICE method and to perform gentle stretching along with adding anti-inflammatory.  Prescribed her ibuprofen 60 mg to take every 6 hours as needed for pain.  Additionally patient on the RICE method attached to the discharge paperwork.  Nursing placed a soft  Ace bandage compression around the ankle.  Strict return precautions given.  Patient agrees with plan.  Patient is stable and being discharged home in good condition.    MDM Rules/Calculators/A&P  Final Clinical Impression(s) / ED Diagnoses Final diagnoses:  Sprain of left ankle, unspecified ligament, initial encounter  Fall, initial encounter    Rx / DC Orders ED Discharge Orders          Ordered    ibuprofen (ADVIL) 600 MG tablet  Every 6 hours PRN        07/28/21 1254             Sherrell Puller, PA-C 07/28/21 1847    Blanchie Dessert, MD 07/29/21 0930

## 2021-07-28 NOTE — Discharge Instructions (Addendum)
You were seen here today for evaluation of your left ankle pain after fall.  Your x-rays showed no bony abnormalities or fracture to your ankle or foot.  You have been referred to a nonsurgical orthopedic physician if this problem does not improve.  Additional information on the RICE method as previously discussed will be included in this discharge paperwork.  You take 600 mg of ibuprofen as prescribed every 6 hours as needed for pain.  You can rest the first few days, but I would start moving and walking on the ankle of the next few days.  If you have any concern, new or worsening symptoms, please return to the nearest emergency department for reevaluation.

## 2021-07-28 NOTE — ED Notes (Signed)
Ace wrap applied. Cms remains intact

## 2021-07-28 NOTE — ED Triage Notes (Signed)
PT c/o trip and fall yesterday twisting ankle. C/o pain and swelling to R ankle. Denies LOC.

## 2022-08-22 ENCOUNTER — Ambulatory Visit (HOSPITAL_COMMUNITY)
Admission: RE | Admit: 2022-08-22 | Discharge: 2022-08-22 | Disposition: A | Discharge status: Home / Self Care | Payer: Medicare HMO | Source: Ambulatory Visit | Attending: Physician Assistant | Admitting: Physician Assistant

## 2022-08-22 ENCOUNTER — Encounter (HOSPITAL_COMMUNITY): Payer: Self-pay

## 2022-08-22 ENCOUNTER — Ambulatory Visit (INDEPENDENT_AMBULATORY_CARE_PROVIDER_SITE_OTHER): Payer: Medicare HMO

## 2022-08-22 VITALS — BP 152/81 | HR 82 | Temp 98.1°F | Resp 16

## 2022-08-22 DIAGNOSIS — M79672 Pain in left foot: Secondary | ICD-10-CM | POA: Diagnosis not present

## 2022-08-22 DIAGNOSIS — S90852A Superficial foreign body, left foot, initial encounter: Secondary | ICD-10-CM | POA: Diagnosis not present

## 2022-08-22 MED ORDER — LIDOCAINE HCL (PF) 1 % IJ SOLN
INTRAMUSCULAR | Status: AC
  Filled 2022-08-22: qty 2

## 2022-08-22 NOTE — ED Triage Notes (Signed)
Pt left foot has some swelling and pain x few days

## 2022-08-22 NOTE — Discharge Instructions (Addendum)
Advised to take ibuprofen or Motrin for aches and discomfort. Advised to use Epsom salt soaks, 10 minutes soaking, 3-4 times throughout the evening over the next several days to decrease swelling and discomfort. Advised to follow-up PCP or return to urgent care as needed.

## 2022-08-22 NOTE — ED Provider Notes (Signed)
[facility]    CSN: [id number] Arrival date & time: 08/22/22  1143      History   Chief Complaint Chief Complaint  Patient presents with   Foot Pain    HPI Cassandra Gould is a 66 y.o. female.   66 year old female with left heel pain.  Patient indicates for the past several days she has been having left heel pain, swelling and discomfort.  Patient indicates she believes that she may have stepped on object on her porch when she was barefoot and this is what caused this problem.  Patient indicates she sees a splinter type appearance on the outside backside of her heel that is tender and mildly swollen.  She indicates this area hurts when she tries to walk and bear weight.   Foot Pain    Past Medical History:  Diagnosis Date   Asthma    bronchites   Bronchitis    Diabetes mellitus    Headache    Hypertension     Patient Active Problem List   Diagnosis Date Noted   Headache 08/02/2018   Weakness 08/01/2018   Intractable headache 08/01/2018   Acute perforated appendicitis s/p lap appendectomy 08/31/2016 08/31/2016   Seasonal allergies 08/31/2016   Obesity (BMI 30-39.9) 08/31/2016   Migraines 08/31/2016   Hypertension    Type 2 diabetes mellitus without complication, with long-term current use of insulin ([place])    Asthma     Past Surgical History:  Procedure Laterality Date   LAPAROSCOPIC APPENDECTOMY N/A 08/31/2016   Procedure: APPENDECTOMY LAPAROSCOPIC;  Surgeon: [name], MD;  Location: [facility];  Service: General;  Laterality: N/A;   TUBAL LIGATION      OB History   No obstetric history on file.      Home Medications    Prior to Admission medications   Medication Sig Start Date End Date Taking? Authorizing Provider  acetaminophen (TYLENOL) 325 MG tablet Take 2 tablets (650 mg total) by mouth every 6 (six) hours as needed for headache (or Fever >/= 101). 08/05/18   Shelly Coss, MD  amLODipine (NORVASC) 10 MG tablet Take 1 tablet (10 mg  total) by mouth daily. 08/06/18   Shelly Coss, MD  atorvastatin (LIPITOR) 40 MG tablet Take 1 tablet (40 mg total) by mouth daily. Patient not taking: Reported on 08/01/2018 07/27/18   Clent Demark, PA-C  blood glucose meter kit and supplies KIT Dispense based on patient and insurance preference. Use up to four times daily as directed. (FOR ICD-9 250.00, 250.01). 12/26/16   Clent Demark, PA-C  gabapentin (NEURONTIN) 300 MG capsule Take 2 capsules (600 mg total) by mouth 3 (three) times daily. 07/27/18   Clent Demark, PA-C  glimepiride (AMARYL) 4 MG tablet Take 4 mg by mouth daily with breakfast.    [provider]  hydrALAZINE (APRESOLINE) 25 MG tablet Take 1 tablet (25 mg total) by mouth 3 (three) times daily. 08/05/18 09/04/18  Shelly Coss, MD  hydrochlorothiazide (HYDRODIURIL) 25 MG tablet Take 1 tablet (25 mg total) by mouth daily. 08/06/18   Shelly Coss, MD  ibuprofen (ADVIL) 600 MG tablet Take 1 tablet (600 mg total) by mouth every 6 (six) hours as needed. 07/28/21   Sherrell Puller, PA-C  Insulin Detemir (LEVEMIR FLEXTOUCH) 100 UNIT/ML Pen Inject 35 Units into the skin daily at 10 pm. 07/27/18   Clent Demark, PA-C  Insulin Pen Needle (BD PEN NEEDLE MICRO U/F) 32G X 6 MM MISC 1 each by Does not  apply route at bedtime. 07/27/18   Clent Demark, PA-C  lisinopril (PRINIVIL,ZESTRIL) 40 MG tablet Take 1 tablet (40 mg total) by mouth daily. 08/06/18   Shelly Coss, MD  metFORMIN (GLUCOPHAGE) 1000 MG tablet TAKE 1 TABLET BY MOUTH TWICE DAILY WITH MEALS Patient taking differently: Take 500 mg by mouth 2 (two) times daily with a meal.  07/27/18   Clent Demark, PA-C  neomycin-bacitracin-polymyxin (NEOSPORIN) ointment Apply 1 application topically every 12 (twelve) hours. Patient not taking: Reported on 08/01/2018 07/27/18   Clent Demark, PA-C  ondansetron (ZOFRAN) 4 MG tablet Take 1 tablet (4 mg total) by mouth every 6 (six) hours. 07/01/21    Curatolo, Adam, DO  polyethylene glycol (MIRALAX / GLYCOLAX) 17 g packet Take 17 g by mouth daily. 07/01/21   Curatolo, Adam, DO  sitaGLIPtin (JANUVIA) 100 MG tablet Take 1 tablet (100 mg total) by mouth daily. 07/27/18   Clent Demark, PA-C    Family History Family History  Problem Relation Age of Onset   Hypertension Mother    Diabetes Mother    Diabetes Sister     Social History Social History   Tobacco Use   Smoking status: Former   Smokeless tobacco: Never  Scientific laboratory technician Use: Never used  Substance Use Topics   Alcohol use: No   Drug use: No     Allergies   Oxycodone   Review of Systems Review of Systems  Musculoskeletal:  Positive for joint swelling. Negative for gait problem (left heel splinter).     Physical Exam Triage Vital Signs ED Triage Vitals  Enc Vitals Group     BP 08/22/22 1156 (!) 152/81     Pulse Rate 08/22/22 1156 82     Resp 08/22/22 1156 16     Temp 08/22/22 1156 98.1 F (36.7 C)     Temp Source 08/22/22 1156 Oral     SpO2 08/22/22 1156 98 %     Weight --      Height --      Head Circumference --      Peak Flow --      Pain Score 08/22/22 1154 6     Pain Loc --      Pain Edu? --      Excl. in [place]? --    No data found.  Updated Vital Signs BP (!) 152/81 (BP Location: Right Arm)   Pulse 82   Temp 98.1 F (36.7 C) (Oral)   Resp 16   SpO2 98%   Visual Acuity Right Eye Distance:   Left Eye Distance:   Bilateral Distance:    Right Eye Near:   Left Eye Near:    Bilateral Near:     Physical Exam Constitutional:      Appearance: Normal appearance.  Musculoskeletal:       Feet:  Feet:     Comments: Left foot: Lateral posterior aspect of the heel with what appears to be a splinter, mild swelling and redness.  There is pain on palpation.  Procedure: The splinter area was cleaned with Betadine, anesthetized with 1.5 cc lidocaine, area was probed with tweezers and splinter material removed.  The splinter tract was  explored without any additional finding splinter present.  No complications. Neurological:     Mental Status: She is alert.      UC Treatments / Results  Labs (all labs ordered are listed, but only abnormal results are displayed) Labs Reviewed - No data  to display  EKG   Radiology DG Os Calcis Left  Result Date: 08/22/2022 CLINICAL DATA:  Splinter in the lateral left heel, removed, evaluating for remnants EXAM: LEFT OS CALCIS - 2+ VIEW COMPARISON:  07/28/2021 FINDINGS: No fracture or dislocation of the left calcaneus. Small plantar calcaneal spur. Diffuse soft tissue edema about the included foot and ankle. Vascular calcinosis. No radiopaque foreign body. Coarse calcification within the soft tissues projecting over the distal Achilles tendon. IMPRESSION: 1. No radiopaque foreign body. Please note that wood and other organic material may be non radiopaque. 2.  Diffuse soft tissue edema. Electronically Signed   By: Delanna Ahmadi M.D.   On: 08/22/2022 12:49    Procedures Procedures (including critical care time)  Medications Ordered in UC Medications - No data to display  Initial Impression / Assessment and Plan / UC Course  I have reviewed the triage vital signs and the nursing notes.  Pertinent labs & imaging results that were available during my care of the patient were reviewed by me and considered in my medical decision making (see chart for details).    Plan: 1.  The left foot pain will be treated with the following: A.  Advised patient take Tylenol or ibuprofen for pain and swelling. 2.  Foreign body in the left foot will be treated with the following: A.  Advised Epsom salt soaks, 10 minutes 3-4 times throughout the day over the next several days to reduce pain and swelling. 3.  Advised follow-up with PCP or return to urgent care if symptoms fail to improve. Final Clinical Impressions(s) / UC Diagnoses   Final diagnoses:  Foot pain, left  Foreign body in left foot,  initial encounter     Discharge Instructions      Advised to take ibuprofen or Motrin for aches and discomfort. Advised to use Epsom salt soaks, 10 minutes soaking, 3-4 times throughout the evening over the next several days to decrease swelling and discomfort. Advised to follow-up PCP or return to urgent care as needed.    ED Prescriptions   None    PDMP not reviewed this encounter.   Nyoka Lint, PA-C 08/22/22 1311

## 2024-08-17 ENCOUNTER — Other Ambulatory Visit: Payer: Self-pay

## 2024-08-18 LAB — SURGICAL PATHOLOGY
